# Patient Record
Sex: Female | Born: 1965 | Race: Black or African American | Hispanic: No | Smoking: Never smoker
Health system: Southern US, Community
[De-identification: ages and names within clinical notes are randomized; demographics above are authoritative.]

## PROBLEM LIST (undated history)

## (undated) DIAGNOSIS — D259 Leiomyoma of uterus, unspecified: Secondary | ICD-10-CM

## (undated) DIAGNOSIS — D649 Anemia, unspecified: Secondary | ICD-10-CM

## (undated) DIAGNOSIS — N950 Postmenopausal bleeding: Secondary | ICD-10-CM

## (undated) DIAGNOSIS — I10 Essential (primary) hypertension: Secondary | ICD-10-CM

## (undated) DIAGNOSIS — N809 Endometriosis, unspecified: Secondary | ICD-10-CM

## (undated) DIAGNOSIS — R51 Headache: Secondary | ICD-10-CM

## (undated) DIAGNOSIS — R519 Headache, unspecified: Secondary | ICD-10-CM

## (undated) HISTORY — DX: Headache: R51

## (undated) HISTORY — PX: UMBILICAL HERNIA REPAIR: SHX196

## (undated) HISTORY — DX: Essential (primary) hypertension: I10

## (undated) HISTORY — PX: ABLATION ON ENDOMETRIOSIS: SHX5787

## (undated) HISTORY — DX: Headache, unspecified: R51.9

## (undated) HISTORY — PX: HERNIA REPAIR: SHX51

## (undated) HISTORY — DX: Anemia, unspecified: D64.9

## (undated) HISTORY — DX: Endometriosis, unspecified: N80.9

---

## 1995-03-07 HISTORY — PX: BREAST MASS EXCISION: SHX1267

## 1997-11-03 ENCOUNTER — Other Ambulatory Visit: Admission: RE | Admit: 1997-11-03 | Discharge: 1997-11-03 | Payer: Self-pay | Admitting: Radiology

## 2000-04-19 ENCOUNTER — Encounter: Admission: RE | Admit: 2000-04-19 | Discharge: 2000-04-19 | Payer: Self-pay | Admitting: Emergency Medicine

## 2000-04-19 ENCOUNTER — Encounter: Payer: Self-pay | Admitting: Emergency Medicine

## 2001-04-24 ENCOUNTER — Encounter: Payer: Self-pay | Admitting: Emergency Medicine

## 2001-04-24 ENCOUNTER — Encounter: Admission: RE | Admit: 2001-04-24 | Discharge: 2001-04-24 | Payer: Self-pay | Admitting: Emergency Medicine

## 2001-05-14 ENCOUNTER — Encounter: Admission: RE | Admit: 2001-05-14 | Discharge: 2001-05-14 | Payer: Self-pay | Admitting: Emergency Medicine

## 2001-05-14 ENCOUNTER — Encounter: Payer: Self-pay | Admitting: Emergency Medicine

## 2001-06-10 ENCOUNTER — Encounter: Payer: Self-pay | Admitting: Surgery

## 2001-06-10 ENCOUNTER — Ambulatory Visit (HOSPITAL_BASED_OUTPATIENT_CLINIC_OR_DEPARTMENT_OTHER): Admission: RE | Admit: 2001-06-10 | Discharge: 2001-06-10 | Payer: Self-pay | Admitting: Surgery

## 2001-06-10 ENCOUNTER — Encounter: Admission: RE | Admit: 2001-06-10 | Discharge: 2001-06-10 | Payer: Self-pay | Admitting: Surgery

## 2001-06-10 ENCOUNTER — Encounter (INDEPENDENT_AMBULATORY_CARE_PROVIDER_SITE_OTHER): Payer: Self-pay | Admitting: *Deleted

## 2001-06-10 HISTORY — PX: BREAST DUCTAL SYSTEM EXCISION: SHX5242

## 2001-07-12 ENCOUNTER — Encounter: Payer: Self-pay | Admitting: Emergency Medicine

## 2001-07-12 ENCOUNTER — Encounter: Admission: RE | Admit: 2001-07-12 | Discharge: 2001-07-12 | Payer: Self-pay | Admitting: Emergency Medicine

## 2003-06-08 ENCOUNTER — Emergency Department (HOSPITAL_COMMUNITY): Admission: AD | Admit: 2003-06-08 | Discharge: 2003-06-08 | Payer: Self-pay | Admitting: Family Medicine

## 2004-03-06 DIAGNOSIS — R011 Cardiac murmur, unspecified: Secondary | ICD-10-CM

## 2004-03-06 HISTORY — DX: Cardiac murmur, unspecified: R01.1

## 2004-03-15 ENCOUNTER — Other Ambulatory Visit: Admission: RE | Admit: 2004-03-15 | Discharge: 2004-03-15 | Payer: Self-pay | Admitting: Family Medicine

## 2004-03-17 ENCOUNTER — Ambulatory Visit (HOSPITAL_COMMUNITY): Admission: RE | Admit: 2004-03-17 | Discharge: 2004-03-17 | Payer: Self-pay | Admitting: Family Medicine

## 2004-03-23 ENCOUNTER — Ambulatory Visit (HOSPITAL_COMMUNITY): Admission: RE | Admit: 2004-03-23 | Discharge: 2004-03-23 | Payer: Self-pay | Admitting: Family Medicine

## 2004-03-23 ENCOUNTER — Encounter: Payer: Self-pay | Admitting: Cardiovascular Disease

## 2004-03-23 ENCOUNTER — Ambulatory Visit: Payer: Self-pay | Admitting: Cardiovascular Disease

## 2004-12-16 ENCOUNTER — Emergency Department (HOSPITAL_COMMUNITY): Admission: EM | Admit: 2004-12-16 | Discharge: 2004-12-16 | Payer: Self-pay | Admitting: Emergency Medicine

## 2005-05-10 ENCOUNTER — Other Ambulatory Visit: Admission: RE | Admit: 2005-05-10 | Discharge: 2005-05-10 | Payer: Self-pay | Admitting: Family Medicine

## 2005-10-27 ENCOUNTER — Ambulatory Visit (HOSPITAL_COMMUNITY): Admission: RE | Admit: 2005-10-27 | Discharge: 2005-10-27 | Payer: Self-pay | Admitting: Obstetrics

## 2005-10-27 HISTORY — PX: HYSTEROSCOPY W/ ENDOMETRIAL ABLATION: SUR665

## 2007-05-29 ENCOUNTER — Emergency Department (HOSPITAL_COMMUNITY): Admission: EM | Admit: 2007-05-29 | Discharge: 2007-05-29 | Payer: Self-pay | Admitting: Emergency Medicine

## 2010-07-22 NOTE — Op Note (Signed)
NAMELASHANTA, [name]:  [id number]   MEDICAL RECORD NO.:  [id number]          PATIENT TYPE:  AMB   LOCATION:  SDC                           FACILITY:  [facility]   PHYSICIAN:  Charles A. Clearance Coots, M.D.DATE OF BIRTH:  May 29, 1965   DATE OF PROCEDURE:  10/27/2005  DATE OF DISCHARGE:                                 OPERATIVE REPORT   PREOPERATIVE DIAGNOSIS:  Menorrhagia.   POSTOPERATIVE DIAGNOSIS:  Menorrhagia.   PROCEDURE:  Hysteroscopy, bipolar endometrial ablation.   SURGEON:  Charles A. Clearance Coots, M.D.   ANESTHESIA:  General.   ESTIMATED BLOOD LOSS:  Minimal.   SPECIMEN:  None.   COMPLICATIONS:  None.   OPERATION:  The patient was brought to the operating room and after  satisfactory general endotracheal anesthesia, the vagina was prepped and  draped in the usual sterile fashion.  A sterile speculum was inserted in the  vaginal vault and the cervix was isolated.  The anterior lip of the cervix  was grasped with a single tooth tenaculum. The uterus was sounded to 9 cm  and the internal os of the uterus was reached at 3 cm from the external os  leaving a cavity length of 6 cm for endometrial ablation. The cavity width  was 4.2 cm.  Hysteroscopy was performed in routine fashion prior to the  procedure and the endometrial surface was noted to be clear without evidence  of polyps or septum.  The NovaSure bipolar endometrial ablation technique  was then performed in routine fashion at power of 139 watts for 76 seconds.  All instruments were then retired.  Post procedure hysteroscopy was  performed and the endometrial surface was noted to be ablated  satisfactorily.  The hysteroscope was removed and all instruments were  retired.  The patient was taken to the recovery room in satisfactory  condition.      Charles A. Clearance Coots, M.D.  Electronically Signed     CAH/MEDQ  D:  10/27/2005  T:  10/27/2005  Job:  [id number]

## 2010-07-22 NOTE — Op Note (Signed)
[facility] [place]  Patient:    Elaine [name], Elaine [name] Visit Number: [id number] MRN: [id number]          Service Type: DSU Location: [facility] Attending Physician:  Charlton Haws Dictated by:   Currie Paris, M.D. Proc. Date: 06/10/01 Admit Date:  06/10/2001 Discharge Date: 06/10/2001   CC:         Reuben Likes, M.D.  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  OPERATION:  Ductal excision of left breast, three oclock position.  SURGEON:  Currie Paris, M.D.  ANESTHESIA:  General (LMA).  CLINICAL HISTORY:  This patient has a left breast mass with a nipple discharge that has been fairly vigorous.  The patient also had a couple of breast masses palpable, one at about the two oclock and one at about the 3:30 positions, about 1 cm in size.  Touching the lower one produced nipple discharge, and both ultrasound and ductogram showed a markedly dilated duct and a mass.  We elected to inject some blue dye and do an excision to include the masses.  DESCRIPTION OF PROCEDURE:  The patient was seen in the holding area and had no further questions.  In the operating room, the two palpable masses were marked, and actually a third very tiny little mass at the areolar margin was also identified and marked.  Blue dye was vigorous in coming out of duct at about the three oclock position.  After satisfactory general anesthesia was obtained, the breast was prepped and draped.  Since the palpable masses were a few centimeters from the nipple areolar edge, I went ahead and made a transverse incision, elevated a flap to get to the superior one, then another flap inferiorly, getting around  and beyond the inferior one, and then a flap going medially so that I was underneath the duct, and divided the ductal tissue, and identified the blue duct.  I previously placed a tear duct probe down that and confirmed that this duct travelled laterally in the breast.  Once I had the duct itself divided from the under surface of the nipple, I grasped that tissue with an Allis, and then did an excision going down basically to the chest wall, and going laterally, taking a couple of centimeters of tissue superiorly and inferiorly to include the palpable masses, and in fact, a fourth palpable mass was noted at the very lateral edge of the excision, and I carried the excision out as far as the latissimus so that I was sure that I had gotten to the edge of the ductal tissue.  I did not see any blue dye in any of the breast tissue that I divided across.  Once this tissue was out, I then shaved a little piece of tissue from the under surface of the nipple where the small subareolar mass was palpable, and this was only about a 2 mm area, but felt firmer, and sent this as a little separate specimen.  I injected some Xylocaine to just help with initial postoperative pain management.  The wound was checked for hemostasis and spent several minutes making sure everything was dry using the cautery.  Breast tissue itself was reapproximated with some 3-0 Vicryl and the skin with 4-0 Monocryl subcuticular  plus Steri-Strips.  The patient tolerated the procedure well.  There were no operative complications.  All counts were correct. Dictated by:   Currie Paris, M.D. Attending Physician:  Charlton Haws DD:  06/10/01 TD:  06/10/01 Job: [id number]

## 2011-03-15 ENCOUNTER — Other Ambulatory Visit: Payer: Self-pay | Admitting: Family Medicine

## 2011-03-15 DIAGNOSIS — Z1231 Encounter for screening mammogram for malignant neoplasm of breast: Secondary | ICD-10-CM

## 2011-04-13 ENCOUNTER — Other Ambulatory Visit: Payer: Self-pay | Admitting: Radiology

## 2011-04-19 ENCOUNTER — Other Ambulatory Visit: Payer: Self-pay | Admitting: Radiology

## 2011-04-19 HISTORY — PX: BREAST BIOPSY: SHX20

## 2011-05-01 ENCOUNTER — Ambulatory Visit (INDEPENDENT_AMBULATORY_CARE_PROVIDER_SITE_OTHER): Payer: Self-pay | Admitting: General Surgery

## 2011-05-18 ENCOUNTER — Encounter (INDEPENDENT_AMBULATORY_CARE_PROVIDER_SITE_OTHER): Payer: Self-pay | Admitting: General Surgery

## 2011-05-22 ENCOUNTER — Encounter (INDEPENDENT_AMBULATORY_CARE_PROVIDER_SITE_OTHER): Payer: Self-pay | Admitting: General Surgery

## 2011-05-22 ENCOUNTER — Ambulatory Visit (INDEPENDENT_AMBULATORY_CARE_PROVIDER_SITE_OTHER): Payer: BC Managed Care – PPO | Admitting: General Surgery

## 2011-05-22 VITALS — BP 128/82 | HR 80 | Temp 97.6°F | Resp 18 | Ht 69.0 in | Wt 162.5 lb

## 2011-05-22 DIAGNOSIS — N63 Unspecified lump in unspecified breast: Secondary | ICD-10-CM

## 2011-05-22 DIAGNOSIS — N6320 Unspecified lump in the left breast, unspecified quadrant: Secondary | ICD-10-CM | POA: Insufficient documentation

## 2011-05-22 NOTE — Progress Notes (Signed)
Patient ID: Elaine [name], female   DOB: 1965/12/31, 46 y.o.   MRN: [id number]  Chief Complaint  Patient presents with  . New Evaluation    left breast mass    HPI Elaine [name] is a 46 y.o. female.   HPI She is referred by Dr. Tilda Burrow because of a persistent left breast mass. This was discovered on physical examination. She was referred to the [facility] and underwent 2 needle biopsies. These demonstrated findings consistent with a benign nipple adenoma. No malignancy was identified.  However, the mass persists and she has been sent here to discuss removal.   Menarche was at age 64 or 88. First child birth was at age 72. She did not breast-feed. No family history of breast cancer.  She has not had a menstrual period since the Norplant procedure about 10 years ago.  History reviewed. No pertinent past medical history.  Past Surgical History  Procedure Date  . Breast biopsy 04/19/11  . Hernia repair     umbilical    History reviewed. No pertinent family history.  Social History History  Substance Use Topics  . Smoking status: Never Smoker   . Smokeless tobacco: Never Used  . Alcohol Use: No    No Known Allergies  No current outpatient prescriptions on file.    Review of Systems Review of Systems  Constitutional: Negative.   HENT: Negative.   Respiratory: Negative.   Cardiovascular: Negative.   Gastrointestinal: Negative.   Genitourinary: Negative.   Musculoskeletal: Negative.   Neurological: Negative.   Hematological: Negative.     Blood pressure 128/82, pulse 80, temperature 97.6 F (36.4 C), temperature source Temporal, resp. rate 18, height 5\' 9"  (1.753 m), weight 162 lb 8 oz (73.71 kg).  Physical Exam Physical Exam  Constitutional:       Tall thin female in no acute distress.  HENT:  Head: Normocephalic and atraumatic.       Wears glasses.  Eyes: Conjunctivae are normal. No scleral icterus.  Neck: Neck supple.  Cardiovascular: Normal rate and  regular rhythm.   Pulmonary/Chest: Effort normal and breath sounds normal.       Left breast-2 cm palpable mass at the 3:00 position of the nipple areolar complex. 2 small punctate wounds adjacent to this. No other palpable masses.  Right breast-no palpable masses or suspicious changes.  No axillary adenopathy. No supraclavicular adenopathy.  Abdominal: Soft. Bowel sounds are normal.       Limited midline scar and absent umbilicus.  Musculoskeletal: Normal range of motion. She exhibits no edema.  Lymphadenopathy:    She has no cervical adenopathy.    Data Reviewed Pathology report and imaging reports.  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    Plan    Removal of left breast mass. The procedure, rationale, and risks have been discussed with her. Risks include but are not limited to bleeding, infection, wound healing problems, anesthesia, recurrence.  She seems to understand all this and would like to proceed.       Lamari Youngers J 05/22/2011, 6:20 PM

## 2011-05-22 NOTE — Patient Instructions (Signed)
We will schedule your surgery

## 2011-06-29 ENCOUNTER — Encounter (INDEPENDENT_AMBULATORY_CARE_PROVIDER_SITE_OTHER): Payer: Self-pay

## 2011-07-09 ENCOUNTER — Encounter (HOSPITAL_COMMUNITY): Payer: Self-pay | Admitting: Emergency Medicine

## 2011-07-09 ENCOUNTER — Emergency Department (HOSPITAL_COMMUNITY)
Admission: EM | Admit: 2011-07-09 | Discharge: 2011-07-09 | Disposition: A | Discharge status: Home / Self Care | Payer: Private Health Insurance - Indemnity | Attending: Emergency Medicine | Admitting: Emergency Medicine

## 2011-07-09 DIAGNOSIS — N63 Unspecified lump in unspecified breast: Secondary | ICD-10-CM | POA: Insufficient documentation

## 2011-07-09 DIAGNOSIS — N6320 Unspecified lump in the left breast, unspecified quadrant: Secondary | ICD-10-CM

## 2011-07-09 DIAGNOSIS — N644 Mastodynia: Secondary | ICD-10-CM | POA: Insufficient documentation

## 2011-07-09 NOTE — ED Notes (Signed)
C/o knot on L breast that has been painful x 2 days.  Reports milky discharge from L breast.

## 2011-07-09 NOTE — ED Notes (Signed)
Pt c/o painful and hard spot on left breast with white discharge.  States she had the same problem on the same breast appox 4 years ago and had the section removed.  No fever/chills, n/v, SOB

## 2011-07-09 NOTE — Discharge Instructions (Signed)
Please return back to the surgery clinic to have them evaluate your breast mass. Tylenol or Motrin for pain. Return to the emergency department for worsening condition or new concerning symptoms

## 2011-07-09 NOTE — ED Notes (Signed)
No rx given, pt voiced understanding to f/u with breast clinic.

## 2011-07-10 NOTE — ED Provider Notes (Signed)
History     CSN: [id number]  Arrival date & time 07/09/11  2159   First MD Initiated Contact with Patient 07/09/11 2305      Chief Complaint  Patient presents with  . Breast Pain    (Consider location/radiation/quality/duration/timing/severity/associated sxs/prior treatment) HPI 46 year old female presents to the emergency department with complaint of enlarging left breast mass. Patient reports she has had a mass in her breast for some time but over the last 2 days she has noticed increase in size and some drainage from her nipple of a milky substance. Patient denies any fever no redness no swelling no warmth to the breast. Patient was seen 2 months ago in the surgery clinic to have this mass excised, but had difficulties with her insurance and was not able to have it done. Per his medical records, patient has had FNA of this mass and it is a benign mass.  History reviewed. No pertinent past medical history.  Past Surgical History  Procedure Date  . Breast biopsy 04/19/11  . Hernia repair     umbilical    No family history on file.  History  Substance Use Topics  . Smoking status: Never Smoker   . Smokeless tobacco: Never Used  . Alcohol Use: No    OB History    Grav Para Term Preterm Abortions TAB SAB Ect Mult Living                  Review of Systems  All other systems reviewed and are negative.    Allergies  Review of patient's allergies indicates no known allergies.  Home Medications   Current Outpatient Rx  Name Route Sig Dispense Refill  . VITAMIN D 400 UNITS PO TABS Oral Take 400 Units by mouth 4 (four) times daily.      BP 130/57  Pulse 66  Temp(Src) 98.3 F (36.8 C) (Oral)  Resp 20  SpO2 99%  Physical Exam  Nursing note and vitals reviewed. Constitutional: She appears well-developed and well-nourished. No distress.  HENT:  Head: Normocephalic and atraumatic.  Neck: Normal range of motion. Neck supple. No JVD present. No tracheal deviation  present. No thyromegaly present.  Cardiovascular: Normal rate, regular rhythm and intact distal pulses.  Exam reveals no gallop and no friction rub.   No murmur heard. Pulmonary/Chest: Effort normal and breath sounds normal. No stridor. No respiratory distress. She has no wheezes. She has no rales. She exhibits no tenderness.  Lymphadenopathy:    She has no cervical adenopathy.  Skin: She is not diaphoretic.       Left breast with irregular firm mass at 4:00 just lateral to her areola. Mass approximately 3 centimeters. No fluctuance, no erythema no edema. No drainage from the nipple noted    ED Course  Procedures (including critical care time)  Labs Reviewed - No data to display No results found.   1. Left breast mass       MDM  46 year old female with left breast mass. Mass does not appear to be infected requiring I&D at this time. It is beyond the scope of the emergency department to remove such a mass. Have referred patient back to the breast clinic and or general surgery for reevaluation to have mass removed        Olivia Mackie, MD 07/10/11 0401

## 2011-09-07 ENCOUNTER — Emergency Department (HOSPITAL_COMMUNITY)
Admission: EM | Admit: 2011-09-07 | Discharge: 2011-09-07 | Disposition: A | Discharge status: Home / Self Care | Payer: No Typology Code available for payment source | Attending: Emergency Medicine | Admitting: Emergency Medicine

## 2011-09-07 ENCOUNTER — Emergency Department (HOSPITAL_COMMUNITY): Payer: No Typology Code available for payment source

## 2011-09-07 ENCOUNTER — Encounter (HOSPITAL_COMMUNITY): Payer: Self-pay | Admitting: Emergency Medicine

## 2011-09-07 DIAGNOSIS — S335XXA Sprain of ligaments of lumbar spine, initial encounter: Secondary | ICD-10-CM | POA: Insufficient documentation

## 2011-09-07 DIAGNOSIS — Y998 Other external cause status: Secondary | ICD-10-CM | POA: Insufficient documentation

## 2011-09-07 DIAGNOSIS — M79609 Pain in unspecified limb: Secondary | ICD-10-CM | POA: Insufficient documentation

## 2011-09-07 DIAGNOSIS — Y93I9 Activity, other involving external motion: Secondary | ICD-10-CM | POA: Insufficient documentation

## 2011-09-07 DIAGNOSIS — S39012A Strain of muscle, fascia and tendon of lower back, initial encounter: Secondary | ICD-10-CM

## 2011-09-07 DIAGNOSIS — Y92410 Unspecified street and highway as the place of occurrence of the external cause: Secondary | ICD-10-CM | POA: Insufficient documentation

## 2011-09-07 DIAGNOSIS — M79671 Pain in right foot: Secondary | ICD-10-CM

## 2011-09-07 DIAGNOSIS — M25512 Pain in left shoulder: Secondary | ICD-10-CM

## 2011-09-07 DIAGNOSIS — M25519 Pain in unspecified shoulder: Secondary | ICD-10-CM | POA: Insufficient documentation

## 2011-09-07 MED ORDER — IBUPROFEN 600 MG PO TABS: 600.0000 mg | ORAL_TABLET | Freq: Four times a day (QID) | ORAL | Status: AC | PRN

## 2011-09-07 MED ORDER — TRAMADOL HCL 50 MG PO TABS
50.0000 mg | ORAL_TABLET | Freq: Four times a day (QID) | ORAL | Status: AC | PRN
Start: 2011-09-07 — End: 2011-09-17

## 2011-09-07 MED ORDER — CYCLOBENZAPRINE HCL 10 MG PO TABS: 10.0000 mg | ORAL_TABLET | Freq: Two times a day (BID) | ORAL | Status: AC | PRN
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
## 2011-09-08 NOTE — ED Provider Notes (Signed)
Medical screening examination/treatment/procedure(s) were performed by non-physician practitioner and as supervising physician I was immediately available for consultation/collaboration.   Suzi Roots, MD 09/08/11 [phone]

## 2011-12-20 DIAGNOSIS — N6002 Solitary cyst of left breast: Secondary | ICD-10-CM | POA: Insufficient documentation

## 2012-04-14 ENCOUNTER — Encounter (HOSPITAL_COMMUNITY): Payer: Self-pay | Admitting: Emergency Medicine

## 2012-04-14 ENCOUNTER — Emergency Department (HOSPITAL_COMMUNITY)
Admission: EM | Admit: 2012-04-14 | Discharge: 2012-04-14 | Disposition: A | Discharge status: Home / Self Care | Payer: BC Managed Care – PPO | Attending: Emergency Medicine | Admitting: Emergency Medicine

## 2012-04-14 DIAGNOSIS — M7022 Olecranon bursitis, left elbow: Secondary | ICD-10-CM

## 2012-04-14 DIAGNOSIS — M25429 Effusion, unspecified elbow: Secondary | ICD-10-CM | POA: Insufficient documentation

## 2012-04-14 DIAGNOSIS — Z79899 Other long term (current) drug therapy: Secondary | ICD-10-CM | POA: Insufficient documentation

## 2012-04-14 DIAGNOSIS — M7020 Olecranon bursitis, unspecified elbow: Secondary | ICD-10-CM | POA: Insufficient documentation

## 2012-04-14 MED ORDER — NAPROXEN 500 MG PO TABS: 500.0000 mg | ORAL_TABLET | Freq: Three times a day (TID) | ORAL | Status: DC

## 2012-04-14 NOTE — ED Notes (Signed)
Pt states she was lifting heavy items at work last Tuesday and her L elbow began to hurt.  Full ROM.

## 2012-04-14 NOTE — ED Provider Notes (Signed)
History     CSN: [id number]  Arrival date & time 04/14/12  1478   First MD Initiated Contact with Patient 04/14/12 [phone]      Chief Complaint  Patient presents with  . Arm Pain    (Consider location/radiation/quality/duration/timing/severity/associated sxs/prior treatment) Patient is a 47 y.o. female presenting with arm pain. The history is provided by the patient.  Arm Pain   patient here complaining of left elbow pain that started 5 days ago after she lifted a heavy object. Pain is localized to her joint and characterized sharp and worse with movement. She has been using anti-inflammatories as well as cold therapy. Denies indirect trauma. Now complains of some joint swelling. No weakness numbness or tingling in her left hand.  History reviewed. No pertinent past medical history.  Past Surgical History  Procedure Laterality Date  . Breast biopsy  04/19/11  . Hernia repair      umbilical    History reviewed. No pertinent family history.  History  Substance Use Topics  . Smoking status: Never Smoker   . Smokeless tobacco: Never Used  . Alcohol Use: No    OB History   Grav Para Term Preterm Abortions TAB SAB Ect Mult Living                  Review of Systems  All other systems reviewed and are negative.    Allergies  Review of patient's allergies indicates no known allergies.  Home Medications   Current Outpatient Rx  Name  Route  Sig  Dispense  Refill  . naproxen sodium (ANAPROX) 220 MG tablet   Oral   Take 220 mg by mouth 2 (two) times daily with a meal.         . vitamin D, CHOLECALCIFEROL, 400 UNITS tablet   Oral   Take 400 Units by mouth daily.            BP 148/76  Pulse 78  Temp(Src) 98.5 F (36.9 C) (Oral)  Resp 16  SpO2 100%  Physical Exam  Nursing note and vitals reviewed. Constitutional: She is oriented to person, place, and time. She appears well-developed and well-nourished.  Non-toxic appearance. No distress.  HENT:  Head:  Normocephalic and atraumatic.  Eyes: Conjunctivae, EOM and lids are normal. Pupils are equal, round, and reactive to light.  Neck: Normal range of motion. Neck supple. No tracheal deviation present. No mass present.  Cardiovascular: Normal rate, regular rhythm and normal heart sounds.  Exam reveals no gallop.   No murmur heard. Pulmonary/Chest: Effort normal and breath sounds normal. No stridor. No respiratory distress. She has no decreased breath sounds. She has no wheezes. She has no rhonchi. She has no rales.  Abdominal: Soft. Normal appearance and bowel sounds are normal. She exhibits no distension. There is no tenderness. There is no rebound and no CVA tenderness.  Musculoskeletal: Normal range of motion. She exhibits no edema and no tenderness.       Left elbow: She exhibits normal range of motion and no effusion. Tenderness found.  Neurological: She is alert and oriented to person, place, and time. She has normal strength. No cranial nerve deficit or sensory deficit. GCS eye subscore is 4. GCS verbal subscore is 5. GCS motor subscore is 6.  Skin: Skin is warm and dry. No abrasion and no rash noted.  Psychiatric: She has a normal mood and affect. Her speech is normal and behavior is normal.    ED Course  Procedures (including  critical care time)  Labs Reviewed - No data to display No results found.   No diagnosis found.    MDM  Pt with likely bursitis , will tx        Toy Baker, MD 04/14/12 [phone]

## 2012-12-01 ENCOUNTER — Emergency Department (HOSPITAL_COMMUNITY)
Admission: EM | Admit: 2012-12-01 | Discharge: 2012-12-01 | Disposition: A | Discharge status: Home / Self Care | Payer: BC Managed Care – PPO | Attending: Emergency Medicine | Admitting: Emergency Medicine

## 2012-12-01 ENCOUNTER — Encounter (HOSPITAL_COMMUNITY): Payer: Self-pay

## 2012-12-01 DIAGNOSIS — L0291 Cutaneous abscess, unspecified: Secondary | ICD-10-CM

## 2012-12-01 DIAGNOSIS — IMO0002 Reserved for concepts with insufficient information to code with codable children: Secondary | ICD-10-CM | POA: Insufficient documentation

## 2012-12-01 DIAGNOSIS — Z791 Long term (current) use of non-steroidal anti-inflammatories (NSAID): Secondary | ICD-10-CM | POA: Insufficient documentation

## 2012-12-01 MED ORDER — CLINDAMYCIN HCL 300 MG PO CAPS: 300.0000 mg | ORAL_CAPSULE | Freq: Four times a day (QID) | ORAL | Status: DC

## 2012-12-01 MED ORDER — HYDROCODONE-ACETAMINOPHEN 5-325 MG PO TABS: 1.0000 | ORAL_TABLET | Freq: Four times a day (QID) | ORAL | Status: DC | PRN

## 2012-12-01 MED ORDER — HYDROCODONE-ACETAMINOPHEN 5-325 MG PO TABS
1.0000 | ORAL_TABLET | Freq: Once | ORAL | Status: AC
  Administered 2012-12-01: 1 via ORAL
  Filled 2012-12-01: qty 1

## 2012-12-01 NOTE — ED Notes (Signed)
She has two areas of erythema/edema at right axilla.  She thinks she may have been bitten by an insect or spider recently.  She is in no distress.

## 2012-12-01 NOTE — ED Provider Notes (Signed)
CSN: [id number]     Arrival date & time 12/01/12  4098 History   First MD Initiated Contact with Patient 12/01/12 0915     Chief Complaint  Patient presents with  . Abscess   (Consider location/radiation/quality/duration/timing/severity/associated sxs/prior Treatment) HPI Comments: 47 year old female presents to the emergency department with an abscess under her right armpit that she noticed 4 days ago. Patient states she was cleaning out her she had been thought she may have been bit by a spider or mosquito, noticed a little bump which has increased over the past 4 days. It is tender to touch. Denies drainage. Denies fever or chills. She tried putting alcohol on it without relief.  Patient is a 47 y.o. female presenting with abscess. The history is provided by the patient.  Abscess Associated symptoms: no fever     History reviewed. No pertinent past medical history. Past Surgical History  Procedure Laterality Date  . Breast biopsy  04/19/11  . Hernia repair      umbilical   No family history on file. History  Substance Use Topics  . Smoking status: Never Smoker   . Smokeless tobacco: Never Used  . Alcohol Use: No   OB History   Grav Para Term Preterm Abortions TAB SAB Ect Mult Living                 Review of Systems  Constitutional: Negative for fever and chills.  Skin:       Positive for abscess.  All other systems reviewed and are negative.    Allergies  Review of patient's allergies indicates no known allergies.  Home Medications   Current Outpatient Rx  Name  Route  Sig  Dispense  Refill  . naproxen (NAPROSYN) 500 MG tablet   Oral   Take 1 tablet (500 mg total) by mouth 3 (three) times daily with meals.   30 tablet   0   . naproxen sodium (ANAPROX) 220 MG tablet   Oral   Take 220 mg by mouth 2 (two) times daily with a meal.         . vitamin D, CHOLECALCIFEROL, 400 UNITS tablet   Oral   Take 400 Units by mouth daily.           BP 146/83   Pulse 72  Temp(Src) 99.2 F (37.3 C) (Oral)  Resp 16  SpO2 100% Physical Exam  Nursing note and vitals reviewed. Constitutional: She is oriented to person, place, and time. She appears well-developed and well-nourished. No distress.  HENT:  Head: Normocephalic and atraumatic.  Mouth/Throat: Oropharynx is clear and moist.  Eyes: Conjunctivae are normal.  Neck: Normal range of motion. Neck supple.  Cardiovascular: Normal rate, regular rhythm and normal heart sounds.   Pulmonary/Chest: Effort normal and breath sounds normal.  Musculoskeletal: Normal range of motion. She exhibits no edema.  Neurological: She is alert and oriented to person, place, and time.  Skin: Skin is warm and dry. She is not diaphoretic.  2 cm area of fluctuance under right axilla, no drainage, no surrounding cellulitis. 1 cm area of fluctuance directly under and above the first, no drainage, no surrounding cellulitis. Tender to touch.  Psychiatric: She has a normal mood and affect. Her behavior is normal.    ED Course  Procedures (including critical care time) INCISION AND DRAINAGE Performed by: Johnnette Gourd Consent: Verbal consent obtained. Risks and benefits: risks, benefits and alternatives were discussed Type: abscess  Body area: right axilla  Anesthesia: local  infiltration  Incision was made with a scalpel.  Local anesthetic: lidocaine 2% with epinephrine  Anesthetic total: 2 ml  Complexity: complex Blunt dissection to break up loculations  Drainage: purulent  Drainage amount: small  Packing material: none  Patient tolerance: Patient tolerated the procedure well with no immediate complications.    Labs Review Labs Reviewed - No data to display Imaging Review No results found.  MDM   1. Abscess    Patient with abscess without cellulitis. Large abscess drained, small amount of purulent material expressed. Patient refuses any further drainage of either of the other abscesses. Given  she refuses drainage, I will put her on antibiotics. She will followup with her PCP. Return precautions discussed. Patient states understanding of plan and is agreeable.    Trevor Mace, PA-C 12/01/12 1046

## 2012-12-01 NOTE — ED Provider Notes (Signed)
Medical screening examination/treatment/procedure(s) were performed by non-physician practitioner and as supervising physician I was immediately available for consultation/collaboration.  Darnette Lampron L Gregroy Dombkowski, MD 12/01/12 1552 

## 2013-12-18 ENCOUNTER — Other Ambulatory Visit: Payer: Self-pay | Admitting: Obstetrics and Gynecology

## 2013-12-19 ENCOUNTER — Other Ambulatory Visit: Payer: Self-pay | Admitting: Nurse Practitioner

## 2013-12-22 ENCOUNTER — Other Ambulatory Visit: Payer: Self-pay | Admitting: Nurse Practitioner

## 2013-12-23 ENCOUNTER — Other Ambulatory Visit: Payer: Self-pay | Admitting: Nurse Practitioner

## 2013-12-23 DIAGNOSIS — N611 Abscess of the breast and nipple: Secondary | ICD-10-CM

## 2013-12-29 ENCOUNTER — Ambulatory Visit
Admission: RE | Admit: 2013-12-29 | Discharge: 2013-12-29 | Disposition: A | Discharge status: Home / Self Care | Payer: BC Managed Care – PPO | Source: Ambulatory Visit | Attending: Nurse Practitioner | Admitting: Nurse Practitioner

## 2013-12-29 ENCOUNTER — Other Ambulatory Visit: Payer: Self-pay | Admitting: Nurse Practitioner

## 2013-12-29 DIAGNOSIS — N611 Abscess of the breast and nipple: Secondary | ICD-10-CM

## 2014-01-09 ENCOUNTER — Ambulatory Visit: Payer: Self-pay | Admitting: Surgery

## 2014-01-09 HISTORY — PX: BREAST MASS EXCISION: SHX1267

## 2014-04-07 ENCOUNTER — Emergency Department (HOSPITAL_COMMUNITY)
Admission: EM | Admit: 2014-04-07 | Discharge: 2014-04-07 | Disposition: A | Discharge status: Home / Self Care | Payer: 59 | Attending: Emergency Medicine | Admitting: Emergency Medicine

## 2014-04-07 ENCOUNTER — Encounter (HOSPITAL_COMMUNITY): Payer: Self-pay | Admitting: Emergency Medicine

## 2014-04-07 DIAGNOSIS — Y998 Other external cause status: Secondary | ICD-10-CM | POA: Diagnosis not present

## 2014-04-07 DIAGNOSIS — Y9289 Other specified places as the place of occurrence of the external cause: Secondary | ICD-10-CM | POA: Insufficient documentation

## 2014-04-07 DIAGNOSIS — S59902A Unspecified injury of left elbow, initial encounter: Secondary | ICD-10-CM | POA: Diagnosis present

## 2014-04-07 DIAGNOSIS — Y9389 Activity, other specified: Secondary | ICD-10-CM | POA: Diagnosis not present

## 2014-04-07 DIAGNOSIS — M778 Other enthesopathies, not elsewhere classified: Secondary | ICD-10-CM | POA: Diagnosis not present

## 2014-04-07 DIAGNOSIS — X58XXXA Exposure to other specified factors, initial encounter: Secondary | ICD-10-CM | POA: Diagnosis not present

## 2014-04-07 DIAGNOSIS — Z79899 Other long term (current) drug therapy: Secondary | ICD-10-CM | POA: Diagnosis not present

## 2014-04-07 LAB — I-STAT TROPONIN, ED: TROPONIN I, POC: 0 ng/mL (ref 0.00–0.08)

## 2014-04-07 MED ORDER — TRAMADOL HCL 50 MG PO TABS: 50.0000 mg | ORAL_TABLET | Freq: Four times a day (QID) | ORAL | Status: DC | PRN

## 2014-04-07 NOTE — ED Notes (Signed)
Pt reported that she had "fluttering in her chest" yesterday after taking Excedrin  Arthritis Strength

## 2014-04-07 NOTE — ED Notes (Signed)
Pt reports l/arm pain after lifting heavy object 2 weeks ago. Denies chest pain. Taking OTC meds, with relief. Pt reports that " l/hand is going numb'. Numbness  X 1 day. Pt has equal grips, r/and l/hand

## 2014-04-07 NOTE — Discharge Instructions (Signed)
Tendinitis Tendinitis is swelling and inflammation of the tendons. Tendons are band-like tissues that connect muscle to bone. Tendinitis commonly occurs in the:   Shoulders (rotator cuff).  Heels (Achilles tendon).  Elbows (triceps tendon). CAUSES Tendinitis is usually caused by overusing the tendon, muscles, and joints involved. When the tissue surrounding a tendon (synovium) becomes inflamed, it is called tenosynovitis. Tendinitis commonly develops in people whose jobs require repetitive motions. SYMPTOMS  Pain.  Tenderness.  Mild swelling. DIAGNOSIS Tendinitis is usually diagnosed by physical exam. Your health care provider may also order X-rays or other imaging tests. TREATMENT Your health care provider may recommend certain medicines or exercises for your treatment. HOME CARE INSTRUCTIONS   Use a sling or splint for as long as directed by your health care provider until the pain decreases.  Put ice on the injured area.  Put ice in a plastic bag.  Place a towel between your skin and the bag.  Leave the ice on for 15-20 minutes, 3-4 times a day, or as directed by your health care provider.  Avoid using the limb while the tendon is painful. Perform gentle range of motion exercises only as directed by your health care provider. Stop exercises if pain or discomfort increase, unless directed otherwise by your health care provider.  Only take over-the-counter or prescription medicines for pain, discomfort, or fever as directed by your health care provider. SEEK MEDICAL CARE IF:   Your pain and swelling increase.  You develop new, unexplained symptoms, especially increased numbness in the hands. MAKE SURE YOU:   Understand these instructions.  Will watch your condition.  Will get help right away if you are not doing well or get worse. Document Released: 02/18/2000 Document Revised: 07/07/2013 Document Reviewed: 05/09/2010 [facility] Patient Information 2015 [place],  [place]. This information is not intended to replace advice given to you by your health care provider. Make sure you discuss any questions you have with your health care provider. Tennis Elbow Your caregiver has diagnosed you with a condition often referred to as "tennis elbow." This results from small tears or soreness (inflammation) at the start (origin) of the extensor muscles of the forearm. Although the condition is often called tennis or golfer's elbow, it is caused by any repetitive action performed by your elbow. HOME CARE INSTRUCTIONS  If the condition has been short lived, rest may be the only treatment required. Using your opposite hand or arm to perform the task may help. Even changing your grip may help rest the extremity. These may even prevent the condition from recurring.  Longer standing problems, however, will often be relieved faster by:  Using anti-inflammatory agents.  Applying ice packs for 30 minutes at the end of the working day, at bed time, or when activities are finished.  Your caregiver may also have you wear a splint or sling. This will allow the inflamed tendon to heal. At times, steroid injections aided with a local anesthetic will be required along with splinting for 1 to 2 weeks. Two to three steroid injections will often solve the problem. In some long standing cases, the inflamed tendon does not respond to conservative (non-surgical) therapy. Then surgery may be required to repair it. MAKE SURE YOU:   Understand these instructions.  Will watch your condition.  Will get help right away if you are not doing well or get worse. Document Released: 02/20/2005 Document Revised: 05/15/2011 Document Reviewed: 10/09/2007 [facility] Patient Information 2015 [place], [place]. This information is not intended to replace advice  given to you by your health care provider. Make sure you discuss any questions you have with your health care provider.

## 2014-04-08 NOTE — ED Provider Notes (Signed)
CSN: [id number]     Arrival date & time 04/07/14  1651 History   First MD Initiated Contact with Patient 04/07/14 1656     Chief Complaint  Patient presents with  . Arm Injury    pt was lifting heavy object two weeks ago     (Consider location/radiation/quality/duration/timing/severity/associated sxs/prior Treatment) HPI Comments: Patient presents to the ER for evaluation of pain in the left arm. Patient reports that she injured her arm lifting a heavy object 2 weeks ago. She has been experiencing pain in the outside portion of the elbow ever since. She has been taking over-the-counter medicine without relief. She reports that the pain worsens with movement of the elbow and now she is having some numbness and tingling in the hand.  Patient is a 49 y.o. female presenting with arm injury.  Arm Injury   History reviewed. No pertinent past medical history. Past Surgical History  Procedure Laterality Date  . Breast biopsy  04/19/11  . Hernia repair      umbilical  . Ablation on endometriosis     Family History  Problem Relation Age of Onset  . Hypertension Mother   . Cancer Father   . Hypertension Father    History  Substance Use Topics  . Smoking status: Never Smoker   . Smokeless tobacco: Never Used  . Alcohol Use: No   OB History    No data available     Review of Systems  Musculoskeletal: Positive for myalgias and arthralgias.  Neurological: Positive for numbness.  All other systems reviewed and are negative.     Allergies  Review of patient's allergies indicates no known allergies.  Home Medications   Prior to Admission medications   Medication Sig Start Date End Date Taking? Authorizing Provider  acetaminophen (TYLENOL) 650 MG CR tablet Take 650 mg by mouth every 8 (eight) hours as needed for pain (pain).   Yes Historical Provider, MD  naproxen sodium (ANAPROX) 220 MG tablet Take 220 mg by mouth daily as needed (pain).    Yes Historical Provider, MD  vitamin  D, CHOLECALCIFEROL, 400 UNITS tablet Take 400 Units by mouth daily.    Yes Historical Provider, MD  traMADol (ULTRAM) 50 MG tablet Take 1 tablet (50 mg total) by mouth every 6 (six) hours as needed. 04/07/14   Orpah Greek, MD   BP 128/72 mmHg  Pulse 87  Temp(Src) 98.2 F (36.8 C) (Oral)  Resp 18  SpO2 98% Physical Exam  Constitutional: She is oriented to person, place, and time. She appears well-developed and well-nourished. No distress.  HENT:  Head: Normocephalic and atraumatic.  Right Ear: Hearing normal.  Left Ear: Hearing normal.  Nose: Nose normal.  Mouth/Throat: Oropharynx is clear and moist and mucous membranes are normal.  Eyes: Conjunctivae and EOM are normal. Pupils are equal, round, and reactive to light.  Neck: Normal range of motion. Neck supple.  Cardiovascular: Regular rhythm, S1 normal and S2 normal.  Exam reveals no gallop and no friction rub.   No murmur heard. Pulses:      Radial pulses are 2+ on the left side.  Pulmonary/Chest: Effort normal and breath sounds normal. No respiratory distress. She exhibits no tenderness.  Abdominal: Soft. Normal appearance and bowel sounds are normal. There is no hepatosplenomegaly. There is no tenderness. There is no rebound, no guarding, no tenderness at McBurney's point and negative Murphy's sign. No hernia.  Musculoskeletal: Normal range of motion.       Left elbow:  She exhibits no swelling, no effusion and no deformity. Tenderness found. Lateral epicondyle tenderness noted.  Neurological: She is alert and oriented to person, place, and time. She has normal strength. No cranial nerve deficit or sensory deficit. Coordination normal. GCS eye subscore is 4. GCS verbal subscore is 5. GCS motor subscore is 6.  Skin: Skin is warm, dry and intact. No rash noted. No cyanosis.  Psychiatric: She has a normal mood and affect. Her speech is normal and behavior is normal. Thought content normal.  Nursing note and vitals  reviewed.   ED Course  Procedures (including critical care time) [facility], ED    Imaging Review No results found.   EKG Interpretation None      MDM   Final diagnoses:  Tendinitis of elbow    She presents to the ER for evaluation of pain in the left elbow. This was related to an injury. Pain has been persistent for 2 weeks and now she is experiencing numbness and tingling distal to the elbow. Examination reveals significant tenderness at the lateral epicondyles and significantly increased pain with range of motion against resistance. This is consistent with a tendinitis. She has normal neurologic function, normal pulses. Cardiac workup was negative. Patient appropriate for discharge, rest, analgesia with NSAIDs. Follow-up with primary doctor. She was told that she might need physical therapy if not improved in the next week or so.    Orpah Greek, MD 04/08/14 [phone]

## 2014-04-13 ENCOUNTER — Ambulatory Visit: Payer: Self-pay | Admitting: Nurse Practitioner

## 2014-06-27 NOTE — Op Note (Signed)
PATIENT NAME:  Elaine [name], Elaine [name] MR#:  [id number] DATE OF BIRTH:  [birth year]  DATE OF PROCEDURE:  01/09/2014  PREOPERATIVE DIAGNOSIS: Left breast mass.   POSTOPERATIVE DIAGNOSIS: Left breast mass.   PROCEDURE: Excision of left breast mass.   SURGEON: Loreli Dollar, MD  ANESTHESIA: General.   INDICATIONS: This 49 year old female reports a 3 year history of intermittent episodes of swelling in the left breast with episodes of drainage of fluid and temporary resolution. She came in for evaluation and had physical findings of a mass in the retroareolar portion of the left breast, which was in the lower outer quadrant. According to palpation, this mass was approximately 2.5 cm in dimension.  Mammogram and ultrasound appeared to have a solid mass. This was considered most likely benign, and due to her recurrent symptoms I elected to excise the mass.   PROCEDURE IN DETAIL: The patient was placed on the operating table in the supine position under general anesthesia. The left breast was prepared with ChloraPrep and draped in a sterile manner. Examination demonstrated a small wound in the skin, just about 5-6 mm across at the border of the areola in the lower outer quadrant. There was also a palpable mass consistent with some 2.5 cm in dimension.   A curvilinear elliptical excision was carried out from the 2:30 o'clock  position to the 5:30 o'clock  position of the left breast, at the border of the areola, and carried down through a thin layer of subcutaneous tissue to encounter a mass. There was some scant serous drainage from the mass, but most the mass appeared to be solid. Dissection was carried out using electrocautery for hemostasis and removed the entire mass. I did tag the 5:30 o'clock end of the skin ellipse with a suture for the pathologist's orientation, and submitted it for pathology. The wound was inspected. Several small bleeding points were cauterized. Hemostasis subsequently appeared to be  intact.   The subcutaneous tissues were infiltrated with 0.5% Sensorcaine with epinephrine. Portions of the subcutaneous tissue were approximated with interrupted 5-0 Monocryl. The skin was closed with a running 5-0 Monocryl subcuticular suture, leaving a small opening at the 5:30 o'clock  position for drainage. Dermabond was applied, but I did not apply Dermabond at the 5:30 o'clock position, leaving this small opening for drainage. The Dermabond was allowed to dry, and I then applied  4x4 gauze dressings with 2-inch paper tape.   The patient tolerated the procedure satisfactorily and was then prepared for transfer to the recovery room.    ____________________________ Lenna Sciara. Rochel Brome, MD jws:MT D: 01/09/2014 08:44:55 ET T: 01/09/2014 10:15:43 ET JOB#: [id number]  cc: Loreli Dollar, MD, <Dictator> Loreli Dollar MD ELECTRONICALLY SIGNED 01/11/2014 12:51

## 2014-06-29 LAB — SURGICAL PATHOLOGY

## 2016-11-23 IMAGING — CR DG ELBOW 2V*L*
1 series · 2 of 2 positions shown · non-contrast
Comparison: None.

CLINICAL DATA: Lifting injury 3 weeks ago with persistent left
shoulder and elbow discomfort with symptoms radiating into the hands
and fingers

EXAM:
LEFT ELBOW - 2 VIEW

[Series 1: kdxr elbow left ap and lateral · 0.14mm/px · 2 of 2 slices shown]
[im 1/2]
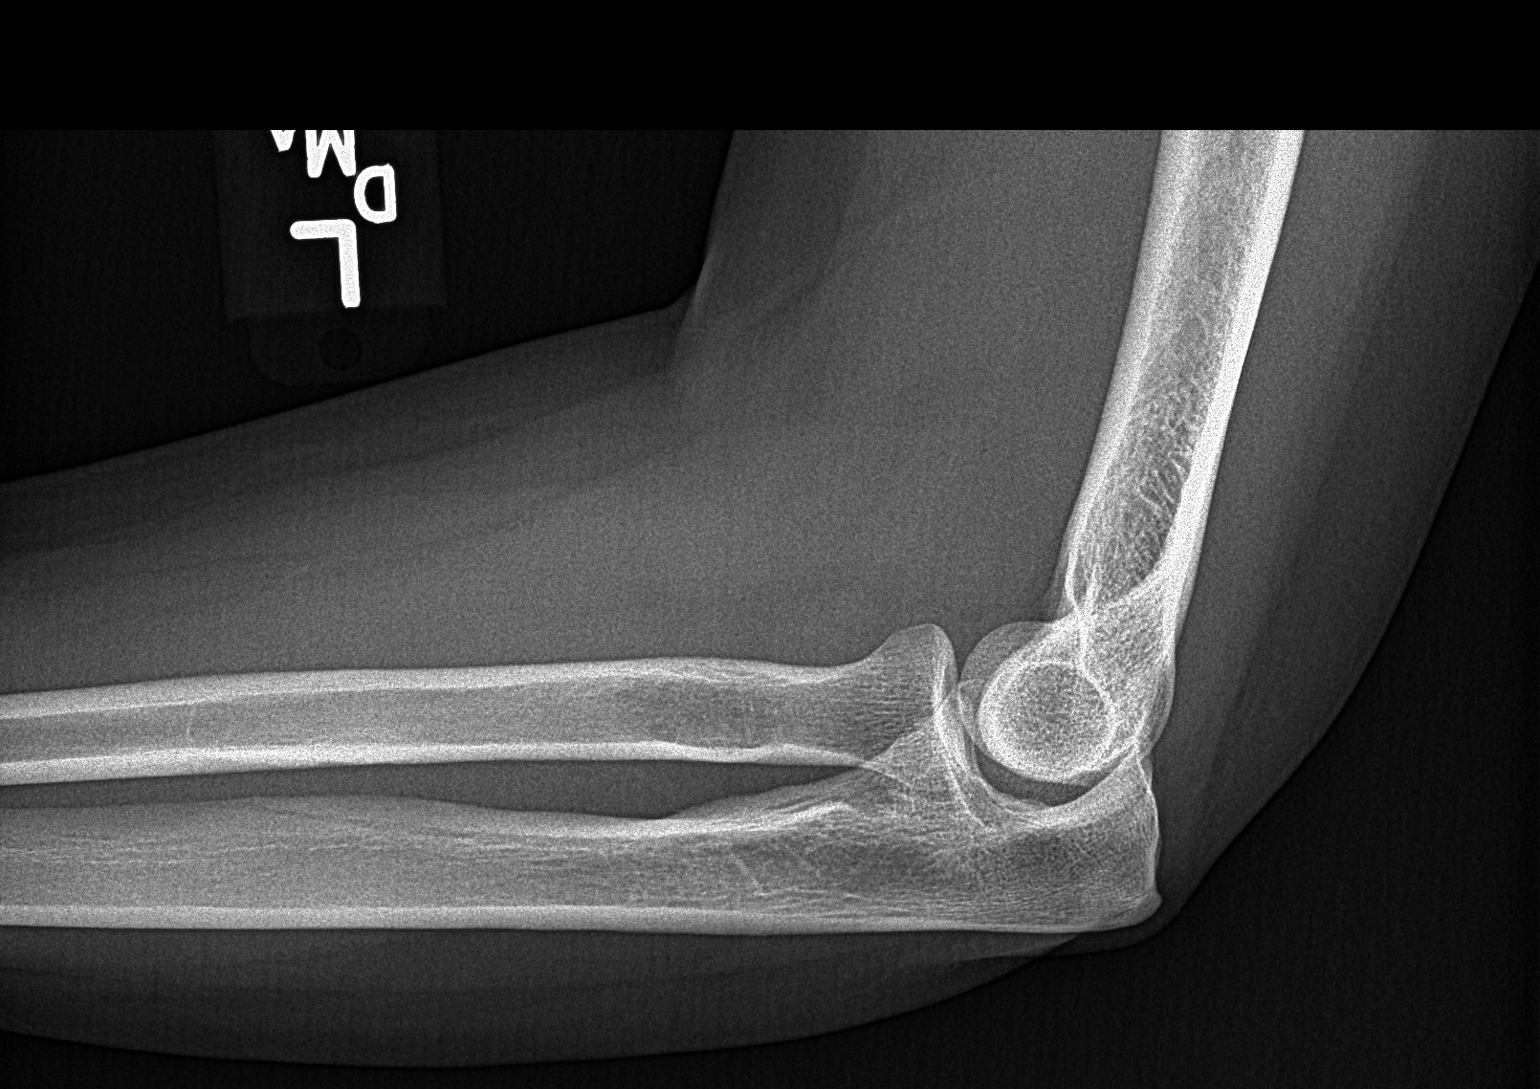
[im 2/2]
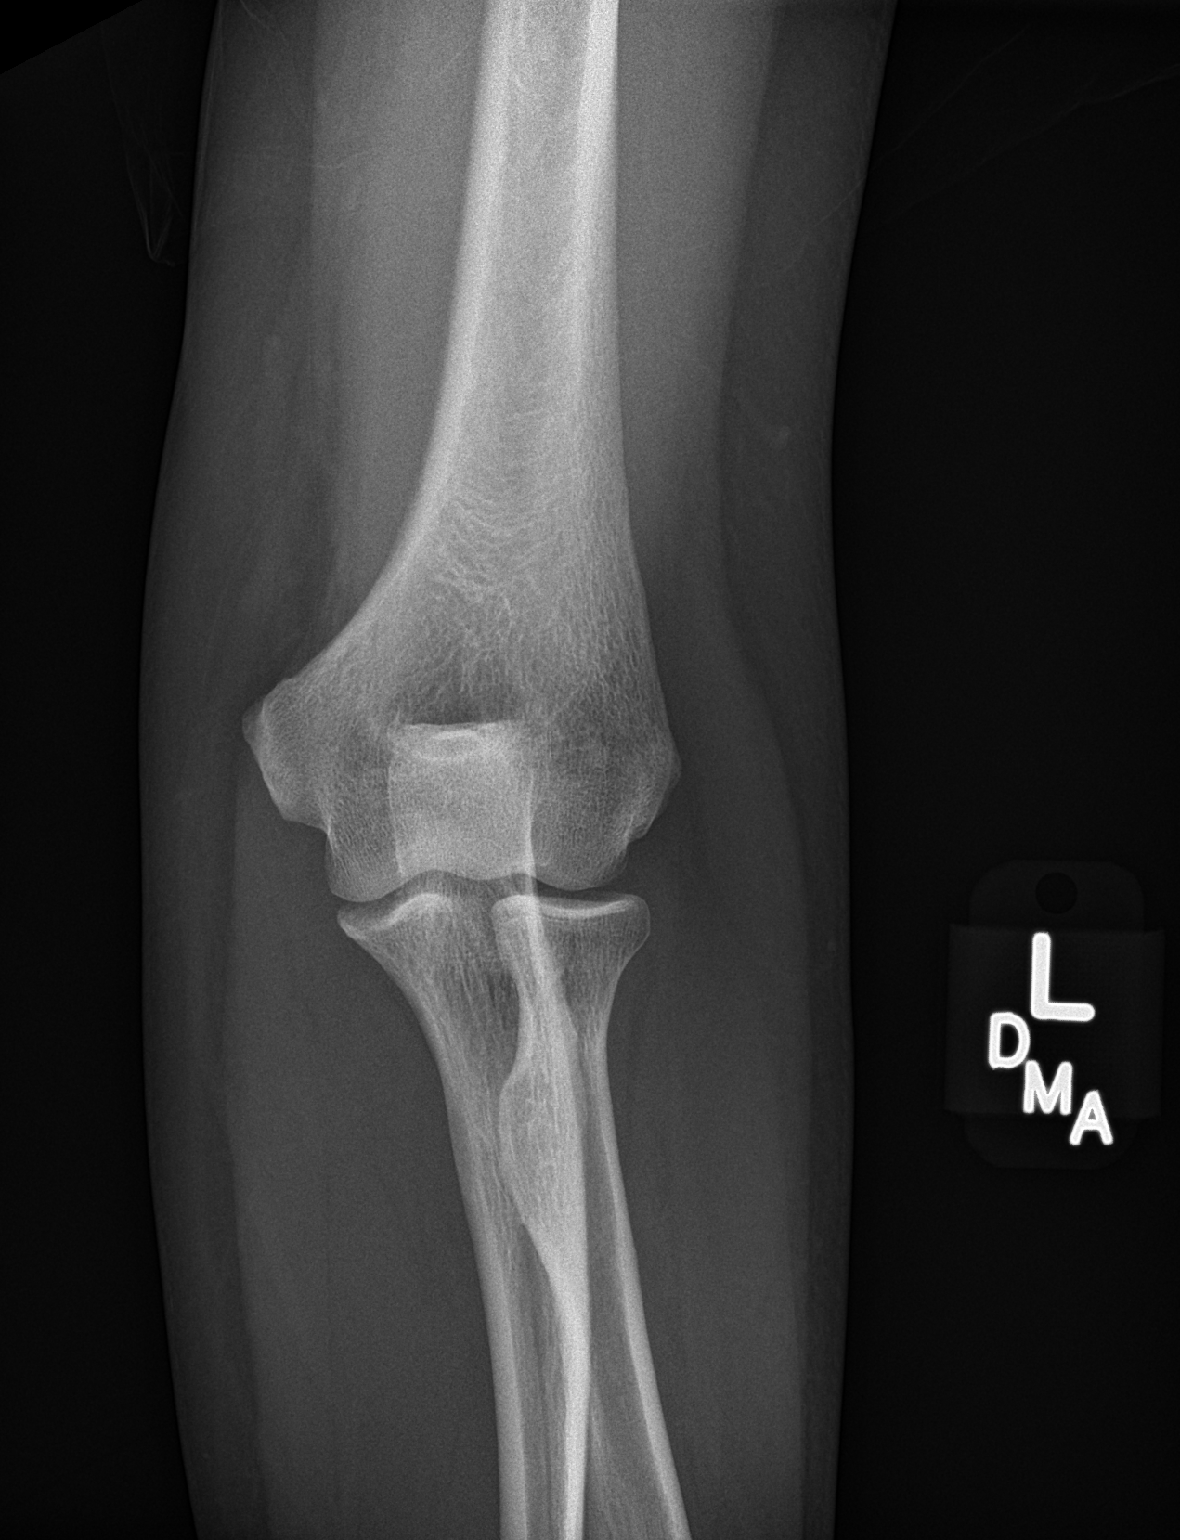

[2 of 2 positions shown; findings below may reference images not displayed]

FINDINGS: The bones of the elbow are adequately mineralized for age. There is
no acute or old fracture. There is no significant osteophyte
formation. There is a small olecranon spur. There is no joint
effusion.
IMPRESSION: There is no acute bony abnormality of the left elbow. There is a
small olecranon spur.

## 2017-08-24 ENCOUNTER — Other Ambulatory Visit (INDEPENDENT_AMBULATORY_CARE_PROVIDER_SITE_OTHER): Payer: BLUE CROSS/BLUE SHIELD

## 2017-08-24 ENCOUNTER — Ambulatory Visit (INDEPENDENT_AMBULATORY_CARE_PROVIDER_SITE_OTHER): Payer: BLUE CROSS/BLUE SHIELD | Admitting: Family Medicine

## 2017-08-24 ENCOUNTER — Encounter: Payer: Self-pay | Admitting: Family Medicine

## 2017-08-24 VITALS — BP 138/90 | HR 77 | Temp 99.1°F | Ht 69.0 in

## 2017-08-24 DIAGNOSIS — M79602 Pain in left arm: Secondary | ICD-10-CM

## 2017-08-24 DIAGNOSIS — Z0001 Encounter for general adult medical examination with abnormal findings: Secondary | ICD-10-CM

## 2017-08-24 DIAGNOSIS — M722 Plantar fascial fibromatosis: Secondary | ICD-10-CM | POA: Diagnosis not present

## 2017-08-24 DIAGNOSIS — Z9889 Other specified postprocedural states: Secondary | ICD-10-CM | POA: Diagnosis not present

## 2017-08-24 LAB — COMPREHENSIVE METABOLIC PANEL
ALK PHOS: 61 U/L (ref 39–117)
ALT: 12 U/L (ref 0–35)
AST: 18 U/L (ref 0–37)
Albumin: 4.2 g/dL (ref 3.5–5.2)
BILIRUBIN TOTAL: 0.9 mg/dL (ref 0.2–1.2)
BUN: 12 mg/dL (ref 6–23)
CO2: 27 mEq/L (ref 19–32)
Calcium: 9.4 mg/dL (ref 8.4–10.5)
Chloride: 104 mEq/L (ref 96–112)
Creatinine, Ser: 0.91 mg/dL (ref 0.40–1.20)
GFR: 83.16 mL/min (ref >60.00)
GLUCOSE: 92 mg/dL (ref 70–99)
Potassium: 3.8 mEq/L (ref 3.5–5.1)
Sodium: 139 mEq/L (ref 135–145)
TOTAL PROTEIN: 7.3 g/dL (ref 6.0–8.3)

## 2017-08-24 LAB — CBC
HCT: 35 % — ABNORMAL LOW (ref 36.0–46.0)
HEMOGLOBIN: 11.7 g/dL — AB (ref 12.0–15.0)
MCHC: 33.3 g/dL (ref 30.0–36.0)
MCV: 80.4 fl (ref 78.0–100.0)
Platelets: 220 10*3/uL (ref 150.0–400.0)
RBC: 4.36 Mil/uL (ref 3.87–5.11)
RDW: 14 % (ref 11.5–15.5)
WBC: 6.8 10*3/uL (ref 4.0–10.5)

## 2017-08-24 LAB — LIPID PANEL
Cholesterol: 203 mg/dL — ABNORMAL HIGH (ref 0–200)
HDL: 46 mg/dL (ref >39.00)
LDL Cholesterol: 141 mg/dL — ABNORMAL HIGH (ref 0–99)
NONHDL: 156.84
Total CHOL/HDL Ratio: 4
Triglycerides: 77 mg/dL (ref 0.0–149.0)
VLDL: 15.4 mg/dL (ref 0.0–40.0)

## 2017-08-24 NOTE — Progress Notes (Signed)
Subjective:  Patient ID: Elaine [name], female    DOB: 1966-01-26  Age: 52 y.o. MRN: [id number]  CC: Establish Care   HPI [facility] presents for establishment of care for follow-up of a few medical problems she has been having.  She is left-hand dominant.  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  She complains of left arm pain that moves from her upper arm into her lower arm.  There is no numbness, tingling or weakness.  She denies neck pain.  No change in strength.  She also has noticed the pain in her left heel that is worse after she has been off her feet for a while.  Noticeably worse with that first step in the morning.  She has had inserts made for this problem and they did not seem to help much.  She had a colonoscopy 2 to 3 years ago and some polyps were seen.  She does not recall follow-up recommendations.  She has never had a mammogram.  She is status post ablation and no longer has menses.  She lives with her daughter and granddaughter.  She does not smoke drink alcohol or use illicit drugs.    Outpatient Medications Prior to Visit  Medication Sig Dispense Refill  . acetaminophen (TYLENOL) 650 MG CR tablet Take 650 mg by mouth every 8 (eight) hours as needed for pain (pain).    . naproxen sodium (ANAPROX) 220 MG tablet Take 220 mg by mouth daily as needed (pain).     . traMADol (ULTRAM) 50 MG tablet Take 1 tablet (50 mg total) by mouth every 6 (six) hours as needed. 15 tablet 0  . vitamin D, CHOLECALCIFEROL, 400 UNITS tablet Take 400 Units by mouth daily.      No facility-administered medications prior to visit.     ROS Review of Systems  Constitutional: Negative for chills, fatigue, fever and unexpected weight change.  HENT: Negative.   Eyes: Negative for photophobia and visual disturbance.  Respiratory: Negative.  Negative for cough, chest tightness and shortness of breath.   Cardiovascular: Negative for chest pain and palpitations.    Gastrointestinal: Negative.   Endocrine: Negative for polyphagia and polyuria.  Genitourinary: Negative for difficulty urinating, frequency and vaginal pain.  Musculoskeletal: Negative for arthralgias, gait problem and neck pain.  Skin: Negative.   Allergic/Immunologic: Negative for immunocompromised state.  Neurological: Negative for weakness and headaches.  Hematological: Does not bruise/bleed easily.  Psychiatric/Behavioral: Negative.     Objective:  BP 138/90   Pulse 77   Temp 99.1 F (37.3 C)   Ht 5\' 9"  (1.753 m)   SpO2 99%   BMI 24.00 kg/m   BP Readings from Last 3 Encounters:  08/24/17 138/90  04/07/14 128/72  12/01/12 159/85    Wt Readings from Last 3 Encounters:  05/22/11 162 lb 8 oz (73.7 kg)    Physical Exam  Constitutional: She appears well-developed and well-nourished. No distress.  HENT:  Head: Normocephalic and atraumatic.  Right Ear: External ear normal.  Left Ear: External ear normal.  Mouth/Throat: Oropharynx is clear and moist. No oropharyngeal exudate.  Eyes: Pupils are equal, round, and reactive to light. Conjunctivae and EOM are normal. Right eye exhibits no discharge. Left eye exhibits no discharge. No scleral icterus.  Neck: Neck supple. No JVD present. No tracheal deviation present. No thyromegaly present.  Cardiovascular: Normal rate, regular rhythm and normal heart sounds.  Pulmonary/Chest: Effort normal and breath sounds normal.  Abdominal: Bowel sounds are normal. She exhibits no distension and no mass. There is no tenderness. There is no rebound and no guarding.  Musculoskeletal:       Left shoulder: She exhibits normal range of motion, no tenderness, no bony tenderness and no swelling.       Feet:  Lymphadenopathy:    She has no cervical adenopathy.  Neurological: She is alert. She has normal strength. She displays no atrophy and no tremor. She exhibits normal muscle tone. She displays no seizure activity.  Reflex Scores:       Brachioradialis reflexes are 1+ on the right side and 1+ on the left side.      Patellar reflexes are 2+ on the right side and 1+ on the left side.      Achilles reflexes are 1+ on the right side and 1+ on the left side. Skin: No rash noted. She is not diaphoretic. No erythema. No pallor.  Psychiatric: She has a normal mood and affect. Her behavior is normal.    Lab Results  Component Value Date   WBC 6.8 08/24/2017   HGB 11.7 (L) 08/24/2017   HCT 35.0 (L) 08/24/2017   PLT 220.0 08/24/2017   GLUCOSE 92 08/24/2017   CHOL 203 (H) 08/24/2017   TRIG 77.0 08/24/2017   HDL 46.00 08/24/2017   LDLCALC 141 (H) 08/24/2017   ALT 12 08/24/2017   AST 18 08/24/2017   NA 139 08/24/2017   K 3.8 08/24/2017   CL 104 08/24/2017   CREATININE 0.91 08/24/2017   BUN 12 08/24/2017   CO2 27 08/24/2017    Dg Elbow 2 Views Left  Result Date: 04/13/2014  PRIOR REPORT IMPORTED FROM AN EXTERNAL SYSTEM  CLINICAL DATA:  Lifting injury 3 weeks ago with persistent left shoulder and elbow discomfort with symptoms radiating into the hands and fingers EXAM: LEFT ELBOW - 2 VIEW COMPARISON:  None. FINDINGS: The bones of the elbow are adequately mineralized for age. There is no acute or old fracture. There is no significant osteophyte formation. There is a small olecranon spur. There is no joint effusion. IMPRESSION: There is no acute bony abnormality of the left elbow. There is a small olecranon spur. Electronically Signed   By: David  Martin[place]   On: 04/13/2014 11:27    Dg Shoulder Left  Result Date: 04/13/2014 PRIOR REPORT IMPORTED FROM AN EXTERNAL SYSTEM  CLINICAL DATA:  Lifting injury 3 weeks ago with left shoulder pain since that time; pain and numbness radiating down the arm to the elbow and into the hands and fingers EXAM: DG SHOULDER 3+VIEWS LEFT COMPARISON:  Left shoulder series of May 29, 2007 FINDINGS: The bones of the shoulder are adequately mineralized. The glenohumeral joint is normal. There is mild  degenerative change of the [facility] joint manifested by spurring and joint space narrowing. The observed portions of the left clavicle, left scapula, and upper left ribs exhibit no acute abnormalities. 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 Otherwise the examination is within the limits of normal. Electronically Signed   By: David  [place]   On: 04/13/2014 11:26     Assessment & Plan:   Elaine [name] was seen today for establish care.  Diagnoses and all orders for this visit:  Encounter for health maintenance examination with abnormal findings -     Cancel: CBC -     Cancel: Comprehensive metabolic panel -     Cancel: Lipid panel -     Urinalysis, Routine  w reflex microscopic -     Cancel: MM Digital Screening; Future -     CBC -     Comprehensive metabolic panel -     Lipid panel -     MM 3D SCREEN BREAST BILATERAL; Future  Plantar fasciitis  Arm pain, diffuse, left  History of breast lump/mass excision -     MM Digital Diagnostic Bilat; Future   I have discontinued Londen Siegman's vitamin D (CHOLECALCIFEROL), naproxen sodium, acetaminophen, and traMADol.  No orders of the defined types were placed in this encounter.  She is 6 hours fasting her labs are pending.  Encouraged her to do stretching exercises before taking the first step in the morning.  We will look for medical records on her colonoscopy that was performed 3 years ago.  She will follow-up for exams.  6/28 addendum: Was informed that patient had an excision of the left breast mass some years ago and was lost to follow-up.  It was recommended that she have a dilated diagnostic mammograms.  This was ordered today.  Follow-up: Return recommended follow up pends lab results.  Please.  Libby Maw, MD

## 2017-08-30 ENCOUNTER — Telehealth: Payer: Self-pay | Admitting: Family Medicine

## 2017-08-30 NOTE — Telephone Encounter (Signed)
Patient calling back to receive lab results. Please advise.      Copied from [place] [id number]. Topic: Quick Communication - Lab Results >> Aug 30, 2017  3:31 PM Noitamyae, Phetcharat, LPN wrote: Called patient to inform them of 08/30/17 lab results. When patient returns call, triage nurse may disclose results.

## 2017-08-31 DIAGNOSIS — Z9889 Other specified postprocedural states: Secondary | ICD-10-CM | POA: Insufficient documentation

## 2017-08-31 NOTE — Telephone Encounter (Signed)
Results given and documented in result note. 

## 2019-04-25 ENCOUNTER — Ambulatory Visit (INDEPENDENT_AMBULATORY_CARE_PROVIDER_SITE_OTHER): Payer: 59 | Admitting: Family Medicine

## 2019-04-25 ENCOUNTER — Encounter: Payer: BLUE CROSS/BLUE SHIELD | Admitting: Family Medicine

## 2019-04-25 ENCOUNTER — Encounter: Payer: Self-pay | Admitting: Family Medicine

## 2019-04-25 ENCOUNTER — Other Ambulatory Visit: Payer: Self-pay

## 2019-04-25 VITALS — BP 140/88 | HR 79 | Temp 98.0°F | Ht 70.0 in | Wt 178.0 lb

## 2019-04-25 DIAGNOSIS — D649 Anemia, unspecified: Secondary | ICD-10-CM | POA: Diagnosis not present

## 2019-04-25 DIAGNOSIS — Z0000 Encounter for general adult medical examination without abnormal findings: Secondary | ICD-10-CM | POA: Diagnosis not present

## 2019-04-25 DIAGNOSIS — S46911A Strain of unspecified muscle, fascia and tendon at shoulder and upper arm level, right arm, initial encounter: Secondary | ICD-10-CM | POA: Diagnosis not present

## 2019-04-25 NOTE — Patient Instructions (Addendum)
Health Maintenance Due  Topic Date Due  . HIV Screening  08/17/1979  . TETANUS/TDAP  08/17/1983  . PAP SMEAR-Modifier  08/16/1985  . COLONOSCOPY  08/17/2014  . MAMMOGRAM  12/30/2015  . INFLUENZA VACCINE  10/05/2018    No flowsheet data found.  Health Maintenance, Female Adopting a healthy lifestyle and getting preventive care are important in promoting health and wellness. Ask your health care provider about:  The right schedule for you to have regular tests and exams.  Things you can do on your own to prevent diseases and keep yourself healthy. What should I know about diet, weight, and exercise? Eat a healthy diet   Eat a diet that includes plenty of vegetables, fruits, low-fat dairy products, and lean protein.  Do not eat a lot of foods that are high in solid fats, added sugars, or sodium. Maintain a healthy weight Body mass index (BMI) is used to identify weight problems. It estimates body fat based on height and weight. Your health care provider can help determine your BMI and help you achieve or maintain a healthy weight. Get regular exercise Get regular exercise. This is one of the most important things you can do for your health. Most adults should:  Exercise for at least 150 minutes each week. The exercise should increase your heart rate and make you sweat (moderate-intensity exercise).  Do strengthening exercises at least twice a week. This is in addition to the moderate-intensity exercise.  Spend less time sitting. Even light physical activity can be beneficial. Watch cholesterol and blood lipids Have your blood tested for lipids and cholesterol at 55 years of age, then have this test every 5 years. Have your cholesterol levels checked more often if:  Your lipid or cholesterol levels are high.  You are older than 55 years of age.  You are at high risk for heart disease. What should I know about cancer screening? Depending on your health history and family  history, you may need to have cancer screening at various ages. This may include screening for:  Breast cancer.  Cervical cancer.  Colorectal cancer.  Skin cancer.  Lung cancer. What should I know about heart disease, diabetes, and high blood pressure? Blood pressure and heart disease  High blood pressure causes heart disease and increases the risk of stroke. This is more likely to develop in people who have high blood pressure readings, are of African descent, or are overweight.  Have your blood pressure checked: ? Every 3-5 years if you are 66-60 years of age. ? Every year if you are 72 years old or older. Diabetes Have regular diabetes screenings. This checks your fasting blood sugar level. Have the screening done:  Once every three years after age 69 if you are at a normal weight and have a low risk for diabetes.  More often and at a younger age if you are overweight or have a high risk for diabetes. What should I know about preventing infection? Hepatitis B If you have a higher risk for hepatitis B, you should be screened for this virus. Talk with your health care provider to find out if you are at risk for hepatitis B infection. Hepatitis C Testing is recommended for:  Everyone born from [birth year] through 1966.  Anyone with known risk factors for hepatitis C. Sexually transmitted infections (STIs)  Get screened for STIs, including gonorrhea and chlamydia, if: ? You are sexually active and are younger than 55 years of age. ? You are older than  55 years of age and your health care provider tells you that you are at risk for this type of infection. ? Your sexual activity has changed since you were last screened, and you are at increased risk for chlamydia or gonorrhea. Ask your health care provider if you are at risk.  Ask your health care provider about whether you are at high risk for HIV. Your health care provider may recommend a prescription medicine to help prevent HIV  infection. If you choose to take medicine to prevent HIV, you should first get tested for HIV. You should then be tested every 3 months for as long as you are taking the medicine. Pregnancy  If you are about to stop having your period (premenopausal) and you may become pregnant, seek counseling before you get pregnant.  Take 400 to 800 micrograms (mcg) of folic acid every day if you become pregnant.  Ask for birth control (contraception) if you want to prevent pregnancy. Osteoporosis and menopause Osteoporosis is a disease in which the bones lose minerals and strength with aging. This can result in bone fractures. If you are 14 years old or older, or if you are at risk for osteoporosis and fractures, ask your health care provider if you should:  Be screened for bone loss.  Take a calcium or vitamin D supplement to lower your risk of fractures.  Be given hormone replacement therapy (HRT) to treat symptoms of menopause. Follow these instructions at home: Lifestyle  Do not use any products that contain nicotine or tobacco, such as cigarettes, e-cigarettes, and chewing tobacco. If you need help quitting, ask your health care provider.  Do not use street drugs.  Do not share needles.  Ask your health care provider for help if you need support or information about quitting drugs. Alcohol use  Do not drink alcohol if: ? Your health care provider tells you not to drink. ? You are pregnant, may be pregnant, or are planning to become pregnant.  If you drink alcohol: ? Limit how much you use to 0-1 drink a day. ? Limit intake if you are breastfeeding.  Be aware of how much alcohol is in your drink. In the [place] one drink equals one 12 oz bottle of beer (355 mL), one 5 oz glass of wine (148 mL), or one 1 oz glass of hard liquor (44 mL). General instructions  Schedule regular health, dental, and eye exams.  Stay current with your vaccines.  Tell your health care provider if: ? You  often feel depressed. ? You have ever been abused or do not feel safe at home. Summary  Adopting a healthy lifestyle and getting preventive care are important in promoting health and wellness.  Follow your health care provider's instructions about healthy diet, exercising, and getting tested or screened for diseases.  Follow your health care provider's instructions on monitoring your cholesterol and blood pressure. This information is not intended to replace advice given to you by your health care provider. Make sure you discuss any questions you have with your health care provider. Document Revised: 02/13/2018 Document Reviewed: 02/13/2018 Elsevier Patient Education  2020 [employer]  Preventive Care 9-24 Years Old, Female Preventive care refers to visits with your health care provider and lifestyle choices that can promote health and wellness. This includes:  A yearly physical exam. This may also be called an annual well check.  Regular dental visits and eye exams.  Immunizations.  Screening for certain conditions.  Healthy lifestyle choices, such  as eating a healthy diet, getting regular exercise, not using drugs or products that contain nicotine and tobacco, and limiting alcohol use. What can I expect for my preventive care visit? Physical exam Your health care provider will check your:  Height and weight. This may be used to calculate body mass index (BMI), which tells if you are at a healthy weight.  Heart rate and blood pressure.  Skin for abnormal spots. Counseling Your health care provider may ask you questions about your:  Alcohol, tobacco, and drug use.  Emotional well-being.  Home and relationship well-being.  Sexual activity.  Eating habits.  Work and work Statistician.  Method of birth control.  Menstrual cycle.  Pregnancy history. What immunizations do I need?  Influenza (flu) vaccine  This is recommended every year. Tetanus, diphtheria,  and pertussis (Tdap) vaccine  You may need a Td booster every 10 years. Varicella (chickenpox) vaccine  You may need this if you have not been vaccinated. Zoster (shingles) vaccine  You may need this after age 48. Measles, mumps, and rubella (MMR) vaccine  You may need at least one dose of MMR if you were born in 1957 or later. You may also need a second dose. Pneumococcal conjugate (PCV13) vaccine  You may need this if you have certain conditions and were not previously vaccinated. Pneumococcal polysaccharide (PPSV23) vaccine  You may need one or two doses if you smoke cigarettes or if you have certain conditions. Meningococcal conjugate (MenACWY) vaccine  You may need this if you have certain conditions. Hepatitis A vaccine  You may need this if you have certain conditions or if you travel or work in places where you may be exposed to hepatitis A. Hepatitis B vaccine  You may need this if you have certain conditions or if you travel or work in places where you may be exposed to hepatitis B. Haemophilus influenzae type b (Hib) vaccine  You may need this if you have certain conditions. Human papillomavirus (HPV) vaccine  If recommended by your health care provider, you may need three doses over 6 months. You may receive vaccines as individual doses or as more than one vaccine together in one shot (combination vaccines). Talk with your health care provider about the risks and benefits of combination vaccines. What tests do I need? Blood tests  Lipid and cholesterol levels. These may be checked every 5 years, or more frequently if you are over 9 years old.  Hepatitis C test.  Hepatitis B test. Screening  Lung cancer screening. You may have this screening every year starting at age 65 if you have a 30-pack-year history of smoking and currently smoke or have quit within the past 15 years.  Colorectal cancer screening. All adults should have this screening starting at age 69  and continuing until age 38. Your health care provider may recommend screening at age 45 if you are at increased risk. You will have tests every 1-10 years, depending on your results and the type of screening test.  Diabetes screening. This is done by checking your blood sugar (glucose) after you have not eaten for a while (fasting). You may have this done every 1-3 years.  Mammogram. This may be done every 1-2 years. Talk with your health care provider about when you should start having regular mammograms. This may depend on whether you have a family history of breast cancer.  BRCA-related cancer screening. This may be done if you have a family history of breast, ovarian, tubal, or  peritoneal cancers.  Pelvic exam and Pap test. This may be done every 3 years starting at age 52. Starting at age 53, this may be done every 5 years if you have a Pap test in combination with an HPV test. Other tests  Sexually transmitted disease (STD) testing.  Bone density scan. This is done to screen for osteoporosis. You may have this scan if you are at high risk for osteoporosis. Follow these instructions at home: Eating and drinking  Eat a diet that includes fresh fruits and vegetables, whole grains, lean protein, and low-fat dairy.  Take vitamin and mineral supplements as recommended by your health care provider.  Do not drink alcohol if: ? Your health care provider tells you not to drink. ? You are pregnant, may be pregnant, or are planning to become pregnant.  If you drink alcohol: ? Limit how much you have to 0-1 drink a day. ? Be aware of how much alcohol is in your drink. In the [place] one drink equals one 12 oz bottle of beer (355 mL), one 5 oz glass of wine (148 mL), or one 1 oz glass of hard liquor (44 mL). Lifestyle  Take daily care of your teeth and gums.  Stay active. Exercise for at least 30 minutes on 5 or more days each week.  Do not use any products that contain nicotine or tobacco,  such as cigarettes, e-cigarettes, and chewing tobacco. If you need help quitting, ask your health care provider.  If you are sexually active, practice safe sex. Use a condom or other form of birth control (contraception) in order to prevent pregnancy and STIs (sexually transmitted infections).  If told by your health care provider, take low-dose aspirin daily starting at age 32. What's next?  Visit your health care provider once a year for a well check visit.  Ask your health care provider how often you should have your eyes and teeth checked.  Stay up to date on all vaccines. This information is not intended to replace advice given to you by your health care provider. Make sure you discuss any questions you have with your health care provider. Document Revised: 11/01/2017 Document Reviewed: 11/01/2017 Elsevier Patient Education  [place].  Preventing Hypertension Hypertension, commonly called high blood pressure, is when the force of blood pumping through the arteries is too strong. Arteries are blood vessels that carry blood from the heart throughout the body. Over time, hypertension can damage the arteries and decrease blood flow to important parts of the body, including the brain, heart, and kidneys. Often, hypertension does not cause symptoms until blood pressure is very high. For this reason, it is important to have your blood pressure checked on a regular basis. Hypertension can often be prevented with diet and lifestyle changes. If you already have hypertension, you can control it with diet and lifestyle changes, as well as medicine. What nutrition changes can be made? Maintain a healthy diet. This includes:  Eating less salt (sodium). Ask your health care provider how much sodium is safe for you to have. The general recommendation is to consume less than 1 tsp (2,300 mg) of sodium a day. ? Do not add salt to your food. ? Choose low-sodium options when grocery shopping and  eating out.  Limiting fats in your diet. You can do this by eating low-fat or fat-free dairy products and by eating less red meat.  Eating more fruits, vegetables, and whole grains. Make a goal to eat: ? 1-2  cups of fresh fruits and vegetables each day. ? 3-4 servings of whole grains each day.  Avoiding foods and beverages that have added sugars.  Eating fish that contain healthy fats (omega-3 fatty acids), such as mackerel or salmon. If you need help putting together a healthy eating plan, try the DASH diet. This diet is high in fruits, vegetables, and whole grains. It is low in sodium, red meat, and added sugars. DASH stands for Dietary Approaches to Stop Hypertension. What lifestyle changes can be made?   Lose weight if you are overweight. Losing just 3?5% of your body weight can help prevent or control hypertension. ? For example, if your present weight is 200 lb (91 kg), a loss of 3-5% of your weight means losing 6-10 lb (2.7-4.5 kg). ? Ask your health care provider to help you with a diet and exercise plan to safely lose weight.  Get enough exercise. Do at least 150 minutes of moderate-intensity exercise each week. ? You could do this in short exercise sessions several times a day, or you could do longer exercise sessions a few times a week. For example, you could take a brisk 10-minute walk or bike ride, 3 times a day, for 5 days a week.  Find ways to reduce stress, such as exercising, meditating, listening to music, or taking a yoga class. If you need help reducing stress, ask your health care provider.  Do not smoke. This includes e-cigarettes. Chemicals in tobacco and nicotine products raise your blood pressure each time you smoke. If you need help quitting, ask your health care provider.  Avoid alcohol. If you drink alcohol, limit alcohol intake to no more than 1 drink a day for nonpregnant women and 2 drinks a day for men. One drink equals 12 oz of beer, 5 oz of wine, or 1 oz  of hard liquor. Why are these changes important? Diet and lifestyle changes can help you prevent hypertension, and they may make you feel better overall and improve your quality of life. If you have hypertension, making these changes will help you control it and help prevent major complications, such as:  Hardening and narrowing of arteries that supply blood to: ? Your heart. This can cause a heart attack. ? Your brain. This can cause a stroke. ? Your kidneys. This can cause kidney failure.  Stress on your heart muscle, which can cause heart failure. What can I do to lower my risk?  Work with your health care provider to make a hypertension prevention plan that works for you. Follow your plan and keep all follow-up visits as told by your health care provider.  Learn how to check your blood pressure at home. Make sure that you know your personal target blood pressure, as told by your health care provider. How is this treated? In addition to diet and lifestyle changes, your health care provider may recommend medicines to help lower your blood pressure. You may need to try a few different medicines to find what works best for you. You also may need to take more than one medicine. Take over-the-counter and prescription medicines only as told by your health care provider. Where to find support Your health care provider can help you prevent hypertension and help you keep your blood pressure at a healthy level. Your local hospital or your community may also provide support services and prevention programs. The American Heart Association offers an online support network at: CheapBootlegs.com.cy Where to find more information Learn more about hypertension  from:  [place]: ElectronicHangman.is  Centers for Disease Control and Prevention: https://ingram.com/  American Academy of Family Physicians:  http://familydoctor.org/familydoctor/en/diseases-conditions/high-blood-pressure.printerview.all.html Learn more about the DASH diet from:  [place] [place]: https://www.reyes.com/ Contact a health care provider if:  You think you are having a reaction to medicines you have taken.  You have recurrent headaches or feel dizzy.  You have swelling in your ankles.  You have trouble with your vision. Summary  Hypertension often does not cause any symptoms until blood pressure is very high. It is important to get your blood pressure checked regularly.  Diet and lifestyle changes are the most important steps in preventing hypertension.  By keeping your blood pressure in a healthy range, you can prevent complications like heart attack, heart failure, stroke, and kidney failure.  Work with your health care provider to make a hypertension prevention plan that works for you. This information is not intended to replace advice given to you by your health care provider. Make sure you discuss any questions you have with your health care provider. Document Revised: 06/14/2018 Document Reviewed: 11/01/2015 Elsevier Patient Education  2020 [employer].
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
## 2019-05-01 ENCOUNTER — Other Ambulatory Visit: Payer: Self-pay

## 2019-05-02 ENCOUNTER — Ambulatory Visit: Payer: Self-pay

## 2019-05-02 ENCOUNTER — Ambulatory Visit (INDEPENDENT_AMBULATORY_CARE_PROVIDER_SITE_OTHER): Payer: 59 | Admitting: Family Medicine

## 2019-05-02 ENCOUNTER — Other Ambulatory Visit (INDEPENDENT_AMBULATORY_CARE_PROVIDER_SITE_OTHER): Payer: 59

## 2019-05-02 ENCOUNTER — Encounter: Payer: Self-pay | Admitting: Family Medicine

## 2019-05-02 VITALS — BP 140/90 | HR 77 | Ht 70.0 in | Wt 176.0 lb

## 2019-05-02 DIAGNOSIS — Z0000 Encounter for general adult medical examination without abnormal findings: Secondary | ICD-10-CM | POA: Diagnosis not present

## 2019-05-02 DIAGNOSIS — M25511 Pain in right shoulder: Secondary | ICD-10-CM

## 2019-05-02 DIAGNOSIS — D649 Anemia, unspecified: Secondary | ICD-10-CM

## 2019-05-02 LAB — LIPID PANEL
Cholesterol: 204 mg/dL — ABNORMAL HIGH (ref 0–200)
HDL: 45.6 mg/dL (ref >39.00)
LDL Cholesterol: 145 mg/dL — ABNORMAL HIGH (ref 0–99)
NonHDL: 158.81
Total CHOL/HDL Ratio: 4
Triglycerides: 68 mg/dL (ref 0.0–149.0)
VLDL: 13.6 mg/dL (ref 0.0–40.0)

## 2019-05-02 LAB — CBC
HCT: 35.9 % — ABNORMAL LOW (ref 36.0–46.0)
Hemoglobin: 11.6 g/dL — ABNORMAL LOW (ref 12.0–15.0)
MCHC: 32.3 g/dL (ref 30.0–36.0)
MCV: 81.3 fl (ref 78.0–100.0)
Platelets: 218 10*3/uL (ref 150.0–400.0)
RBC: 4.42 Mil/uL (ref 3.87–5.11)
RDW: 13.8 % (ref 11.5–15.5)
WBC: 9.2 10*3/uL (ref 4.0–10.5)

## 2019-05-02 LAB — URINALYSIS, ROUTINE W REFLEX MICROSCOPIC
Bilirubin Urine: NEGATIVE
Ketones, ur: NEGATIVE
Nitrite: NEGATIVE
Specific Gravity, Urine: 1.02 (ref 1.000–1.030)
Total Protein, Urine: NEGATIVE
Urine Glucose: NEGATIVE
Urobilinogen, UA: 0.2 (ref 0.0–1.0)
pH: 6 (ref 5.0–8.0)

## 2019-05-02 LAB — COMPREHENSIVE METABOLIC PANEL
ALT: 19 U/L (ref 0–35)
AST: 30 U/L (ref 0–37)
Albumin: 4.3 g/dL (ref 3.5–5.2)
Alkaline Phosphatase: 75 U/L (ref 39–117)
BUN: 13 mg/dL (ref 6–23)
CO2: 29 mEq/L (ref 19–32)
Calcium: 10.1 mg/dL (ref 8.4–10.5)
Chloride: 104 mEq/L (ref 96–112)
Creatinine, Ser: 0.9 mg/dL (ref 0.40–1.20)
GFR: 78.74 mL/min (ref >60.00)
Glucose, Bld: 112 mg/dL — ABNORMAL HIGH (ref 70–99)
Potassium: 4.5 mEq/L (ref 3.5–5.1)
Sodium: 141 mEq/L (ref 135–145)
Total Bilirubin: 0.8 mg/dL (ref 0.2–1.2)
Total Protein: 7.8 g/dL (ref 6.0–8.3)

## 2019-05-02 LAB — VITAMIN B12: Vitamin B-12: 226 pg/mL (ref 211–911)

## 2019-05-02 LAB — TSH: TSH: 1.07 u[IU]/mL (ref 0.35–4.50)

## 2019-05-02 NOTE — Patient Instructions (Addendum)
Thank you for coming in today.  Call or go to the ER if you develop a large red swollen joint with extreme pain or oozing puss.   Do the exercises we discussed.  Attend PT.  Recheck in about 6 weeks.  Return or contact me sooner if needed.   Please perform the exercise program that we have prepared for you and gone over in detail on a daily basis.  In addition to the handout you were provided you can access your program through: [email or web address]   Your unique program code is:  [id number]

## 2019-05-02 NOTE — Progress Notes (Signed)
Subjective:    I'm seeing this patient as a consultation for:  Dr. Ethelene Hal. Note will be routed back to referring provider/PCP.  CC: R shoulder pain  I, Molly Weber, LAT, ATC, am serving as scribe for Dr. Lynne Leader.  HPI: Pt is a 54 y/o female presenting w/ c/o R shoulder pain x one month w/ no specific MOI.  She locates the pain to her R shoulder and lateral upper arm.   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  She denies neck pain, radiating R UE pain, weakness or numbness/tingling into the R UE. Rates pain 7/10 sharp pain that comes and goes has to sleep on a pillow under her shoulder to be able to sleep.  Aggravating factors: none Treatments tried: icy hot, ice and heat, and aleve   Past medical history, Surgical history, Family history, Social history, Allergies, and medications have been entered into the medical record, reviewed.   Review of Systems: No new headache, visual changes, nausea, vomiting, diarrhea, constipation, dizziness, abdominal pain, skin rash, fevers, chills, night sweats, weight loss, swollen lymph nodes, body aches, joint swelling, muscle aches, chest pain, shortness of breath, mood changes, visual or auditory hallucinations.   Objective:    Vitals:   05/02/19 0936  BP: 140/90  Pulse: 77  SpO2: 99%   General: Well Developed, well nourished, and in no acute distress.  Neuro/Psych: Alert and oriented x3, extra-ocular muscles intact, able to move all 4 extremities, sensation grossly intact. Skin: Warm and dry, no rashes noted.  Respiratory: Not using accessory muscles, speaking in full sentences, trachea midline.  Cardiovascular: Pulses palpable, no extremity edema. Abdomen: Does not appear distended. MSK: Right shoulder Normal-appearing nontender. Normal motion however pain with abduction beyond 120 degrees.  Normal abduction external and internal  rotation. Strength: 4/5 abduction with pain.  5/5 internal and external rotation without pain. Positive empty can test. Positive Hawkins and Neer's test. Positive speeds test negative Yergason's test. Negative crossover arm compression test.  Contralateral left shoulder normal-appearing nontender normal motion normal strength negative impingement testing.  Pulses cap refill and sensation intact bilateral upper extremities.  Lab and Radiology Results Diagnostic Limited MSK Ultrasound of: Right shoulder Biceps tendon normal-appearing intact in bicipital groove. Subscapularis tendon normal-appearing. Supraspinatus tendon intact without tear.  Increased subacromial bursa thickness present. Infraspinatus tendon normal-appearing. AC joint mild effusion. Impression: Subacromial bursitis  Procedure: Real-time Ultrasound Guided Injection of right shoulder subacromial bursa Device: Philips Affiniti 50G Images permanently stored and available for review in the ultrasound unit. Verbal informed consent obtained.  Discussed risks and benefits of procedure. Warned about infection bleeding damage to structures skin hypopigmentation and fat atrophy among others. Patient expresses understanding and agreement Time-out conducted.   Noted no overlying erythema, induration, or other signs of local infection.   Skin prepped in a sterile fashion.   Local anesthesia: Topical Ethyl chloride.   With sterile technique and under real time ultrasound guidance:  40 mg of Kenalog and 2 mL of Marcaine injected easily.   Completed without difficulty   Pain immediately resolved suggesting accurate placement of the medication.   Advised to call if fevers/chills, erythema, induration, drainage, or persistent bleeding.   Images permanently stored and available for review in the ultrasound unit.  Impression: Technically successful ultrasound guided injection.        Impression and Recommendations:    Assessment  and Plan:  54 y.o. female with right shoulder pain ongoing for 3 months.  Exam and ultrasound consistent with shoulder impingement/bursitis/tendinitis.  Plan for home exercise program as taught in clinic today by ATC, referral to physical therapy, and subacromial bursa injection as above.  Recheck back in about 6 weeks.  Return sooner if needed if not improved. Next step if not better would be x-ray and MRI for surgical planning.   Orders Placed This Encounter  Procedures  . [place] LIMITED JOINT SPACE STRUCTURES UP RIGHT(NO LINKED CHARGES)    Order Specific Question:   Reason for Exam (SYMPTOM  OR DIAGNOSIS REQUIRED)    Answer:   R shoulder pain    Order Specific Question:   Preferred imaging location?    Answer:   [place]  . Ambulatory referral to Physical Therapy    Referral Priority:   Routine    Referral Type:   Physical Medicine    Referral Reason:   Specialty Services Required    Requested Specialty:   Physical Therapy   No orders of the defined types were placed in this encounter.   Discussed warning signs or symptoms. Please see discharge instructions. Patient expresses understanding.   The above documentation has been reviewed and is accurate and complete Lynne Leader

## 2019-05-03 LAB — IRON,TIBC AND FERRITIN PANEL
%SAT: 17 % (calc) (ref 16–45)
Ferritin: 69 ng/mL (ref 16–232)
Iron: 63 ug/dL (ref 45–160)
TIBC: 371 mcg/dL (calc) (ref 250–450)

## 2019-05-03 LAB — HIV ANTIBODY (ROUTINE TESTING W REFLEX): HIV 1&2 Ab, 4th Generation: NONREACTIVE

## 2019-05-09 ENCOUNTER — Other Ambulatory Visit (HOSPITAL_COMMUNITY)
Admission: RE | Admit: 2019-05-09 | Discharge: 2019-05-09 | Disposition: A | Discharge status: Home / Self Care | Payer: 59 | Source: Ambulatory Visit | Attending: Obstetrics & Gynecology | Admitting: Obstetrics & Gynecology

## 2019-05-09 ENCOUNTER — Other Ambulatory Visit: Payer: Self-pay

## 2019-05-09 ENCOUNTER — Encounter: Payer: Self-pay | Admitting: Certified Nurse Midwife

## 2019-05-09 ENCOUNTER — Ambulatory Visit: Payer: 59 | Admitting: Certified Nurse Midwife

## 2019-05-09 VITALS — BP 120/78 | HR 70 | Temp 98.2°F | Resp 16 | Ht 69.25 in | Wt 174.0 lb

## 2019-05-09 DIAGNOSIS — Z124 Encounter for screening for malignant neoplasm of cervix: Secondary | ICD-10-CM

## 2019-05-09 DIAGNOSIS — N951 Menopausal and female climacteric states: Secondary | ICD-10-CM | POA: Diagnosis not present

## 2019-05-09 DIAGNOSIS — M5412 Radiculopathy, cervical region: Secondary | ICD-10-CM | POA: Insufficient documentation

## 2019-05-09 DIAGNOSIS — Z01419 Encounter for gynecological examination (general) (routine) without abnormal findings: Secondary | ICD-10-CM | POA: Diagnosis not present

## 2019-05-09 NOTE — Progress Notes (Signed)
54 y.o. [id number] Legally Separated  African American Fe here to establish gyn care and  for annual exam. Has not had Gyn exam in about 4 years. Menopausal ? Denies any hot flashes or night sweats noted. Denies vaginal dryness. Had serum STD screening with PCP. Patient has had not vaginal bleeding or spotting since ablation approximate 15 years for heavy periods and anemia. History of left breast biopsy for mass and was benign. No mammogram in 6 years. No other health issues today.  No LMP recorded. Patient has had an ablation.          Sexually active: No. in the past 4 years. The current method of family planning is tubal ligation, ablation.    Exercising: Yes.    walking Smoker:  no  Review of Systems  Constitutional: Negative.   HENT: Negative.   Eyes: Negative.   Respiratory: Negative.   Cardiovascular: Negative.   Gastrointestinal: Negative.   Genitourinary: Negative.   Musculoskeletal: Negative.   Skin: Negative.   Neurological: Negative.   Endo/Heme/Allergies: Negative.   Psychiatric/Behavioral: Negative.     Health Maintenance: Pap:  45yrs ago neg per patient History of Abnormal Pap: no MMG:  12-29-13 bilateral & left breast u/s category c density birads 3:probably benign Self Breast exams: yes Colonoscopy: maybe 28yrs ago BMD:   none TDaP:  2013 Shingles: not done Pneumonia: not done Hep C and HIV: HIV neg 2021 Labs: yes   reports that she has never smoked. She has never used smokeless tobacco. She reports that she does not drink alcohol or use drugs.  Past Medical History:  Diagnosis Date  . Anemia   . Endometriosis   . Frequent headaches   . Hypertension    not on medication     Past Surgical History:  Procedure Laterality Date  . ABLATION ON ENDOMETRIOSIS    . BREAST BIOPSY  04/19/11  . HERNIA REPAIR     umbilical    Current Outpatient Medications  Medication Sig Dispense Refill  . Multiple Vitamin (MULTIVITAMIN PO) Take by mouth.    . Naproxen Sodium  (ALEVE) 220 MG CAPS Aleve     No current facility-administered medications for this visit.    Family History  Problem Relation Age of Onset  . Hypertension Mother   . Diabetes Mother   . Heart disease Mother   . Cancer Father   . Cancer Sister   . Pancreatic cancer Sister   . Hypertension Sister   . Hypertension Sister   . Hypertension Sister     ROS:  Pertinent items are noted in HPI.  Otherwise, a comprehensive ROS was negative.  Exam:   BP 120/78   Pulse 70   Temp 98.2 F (36.8 C) (Skin)   Resp 16   Ht 5' 9.25" (1.759 m)   Wt 174 lb (78.9 kg)   BMI 25.51 kg/m  Height: 5' 9.25" (175.9 cm) Ht Readings from Last 3 Encounters:  05/09/19 5' 9.25" (1.759 m)  05/02/19 5\' 10"  (1.778 m)  04/25/19 5\' 10"  (1.778 m)    General appearance: alert, cooperative and appears stated age Head: Normocephalic, without obvious abnormality, atraumatic Neck: no adenopathy, supple, symmetrical, trachea midline and thyroid normal to inspection and palpation Lungs: clear to auscultation bilaterally Breasts: normal appearance, no masses or tenderness, No nipple retraction or dimpling, No nipple discharge or bleeding, No axillary or supraclavicular adenopathy, scar on left breast from biopsy noted Heart: regular rate and rhythm Abdomen: soft, non-tender; no masses,  no  organomegaly,scarring from previous umbilical hernia repair and BTL noted, no masses Extremities: extremities normal, atraumatic, no cyanosis or edema Skin: Skin color, texture, turgor normal. No rashes or lesions Lymph nodes: Cervical, supraclavicular, and axillary nodes normal. No abnormal inguinal nodes palpated Neurologic: Grossly normal   Pelvic: External genitalia:  no lesions, normal female              Urethra:  normal appearing urethra with no masses, tenderness or lesions              Bartholin's and Skene's: normal                 Vagina: normal appearing vagina with normal color and discharge, no lesions               Cervix: anteverted, multiparous appearance, no cervical motion tenderness and no lesions              Pap taken: Yes.   Bimanual Exam:  Uterus:  normal size, contour, position, consistency, mobility, non-tender and anteverted              Adnexa: normal adnexa and no mass, fullness, tenderness               Rectovaginal: Confirms               Anus:  normal sphincter tone, no lesions  Chaperone present: yes  A:  Well Woman with normal exam  Amenorrhea ? Menopausal, not symptomatic  History of endometrial ablation 15 years ago  History of left breast mass with benign biopsy per patient  History of umbilical hernia repair and BTL  Colonoscopy due, patient has scheduled, previous polyp  P:   Reviewed health and wellness pertinent to exam  Discussed even though asymptomatic she probably is menopausal. Recommend lab to confirm, patient agreeable.  Lab: [facility]  Discussed even though she has had ablation if she has any bleeding this is a concern and she needs to call and come in.  Continue yearly mammograms as recommended  Discussed abdominal appearance from scarring and do not feel there is concern. She needs to continue follow up with PCP.  Pap smear: yes   counseled on breast self exam, mammography screening, STD prevention, HIV risk factors and prevention, feminine hygiene, menopause, adequate intake of calcium and vitamin D, diet and exercise  return annually or prn  An After Visit Summary was printed and given to the patient.

## 2019-05-09 NOTE — Patient Instructions (Signed)

## 2019-05-10 LAB — FOLLICLE STIMULATING HORMONE: FSH: 60.4 m[IU]/mL

## 2019-05-12 ENCOUNTER — Telehealth: Payer: Self-pay

## 2019-05-12 NOTE — Telephone Encounter (Signed)
Left message for call back.

## 2019-05-12 NOTE — Telephone Encounter (Signed)
-----   Message from Regina Eck, CNM sent at 05/10/2019 10:38 AM EST ----- Notify patient her [employer] shows menopausal level, so if she has any vaginal bleeding this would be considered post menopausal bleeding and she needs to call.

## 2019-05-13 LAB — CYTOLOGY - PAP: Diagnosis: NEGATIVE

## 2019-05-13 NOTE — Telephone Encounter (Signed)
Patient notified of results. See lab 

## 2019-05-16 ENCOUNTER — Ambulatory Visit: Payer: 59

## 2019-05-21 ENCOUNTER — Other Ambulatory Visit: Payer: Self-pay

## 2019-05-21 ENCOUNTER — Ambulatory Visit (INDEPENDENT_AMBULATORY_CARE_PROVIDER_SITE_OTHER): Payer: 59 | Admitting: Family Medicine

## 2019-05-21 ENCOUNTER — Encounter: Payer: Self-pay | Admitting: Family Medicine

## 2019-05-21 VITALS — Ht 69.0 in

## 2019-05-21 DIAGNOSIS — D649 Anemia, unspecified: Secondary | ICD-10-CM | POA: Insufficient documentation

## 2019-05-21 DIAGNOSIS — E7800 Pure hypercholesterolemia, unspecified: Secondary | ICD-10-CM | POA: Insufficient documentation

## 2019-05-21 DIAGNOSIS — R8290 Unspecified abnormal findings in urine: Secondary | ICD-10-CM | POA: Diagnosis not present

## 2019-05-21 NOTE — Progress Notes (Signed)
Established Patient Office Visit  Subjective:  Patient ID: Elaine Elaine [name], female    DOB: [birth year]  Age: 54 y.o. MRN: [id number]  CC: No chief complaint Elaine [name] file.   HPI Elaine Elaine [name] presents for follow-up discussion of her recent lab work.  She did go for a Pap smear and it was normal.  She is seeing sports medicine for her shoulder problem.  She was scheduled for colonoscopy and was told that they could not see her until they had seen the records of her previous colonoscopy.  The problem is that she is does not remember exactly when that happened but it is been over 10 years ago.  She has had no problems with urination.  There is been no burning or frequency.  Her UA drawn at the physical was abnormal.  Hemoglobin was slightly depressed with a lowish MCV.  Her iron levels were low normal.  She is seeing no blood in her urine or stool.  She is aware that the follow-up: Anoscopy is important.  Past Medical History:  Diagnosis Date  . Anemia   . Endometriosis   . Frequent headaches    no migraine symptoms  . Hypertension    not Elaine [name] medication     Past Surgical History:  Procedure Laterality Date  . ABLATION Elaine [name] ENDOMETRIOSIS    . BREAST BIOPSY  04/19/11  . HERNIA REPAIR     umbilical    Family History  Problem Relation Age of Onset  . Hypertension Mother   . Diabetes Mother   . Heart disease Mother   . Cancer Father   . Cancer Sister   . Pancreatic cancer Sister   . Hypertension Sister   . Hypertension Sister   . Hypertension Sister     Social History   Socioeconomic History  . Marital status: Legally Separated    Spouse name: Not Elaine [name] file  . Number of children: Not Elaine [name] file  . Years of education: Not Elaine [name] file  . Highest education level: Not Elaine [name] file  Occupational History  . Not Elaine [name] file  Tobacco Use  . Smoking status: Never Smoker  . Smokeless tobacco: Never Used  Substance and Sexual Activity  . Alcohol use: No  . Drug use: No  . Sexual activity: Not Currently     Partners: Male    Birth control/protection: Surgical    Comment: BTL  Other Topics Concern  . Not Elaine [name] file  Social History Narrative  . Not Elaine [name] file   Social Determinants of Health   Financial Resource Strain:   . Difficulty of Paying Living Expenses:   Food Insecurity:   . Worried About Charity fundraiser in the Last Year:   . Arboriculturist in the Last Year:   Transportation Needs:   . Film/video editor (Medical):   Marland Kitchen Lack of Transportation (Non-Medical):   Physical Activity:   . Days of Exercise per Week:   . Minutes of Exercise per Session:   Stress:   . Feeling of Stress :   Social Connections:   . Frequency of Communication with Friends and Family:   . Frequency of Social Gatherings with Friends and Family:   . Attends Religious Services:   . Active Member of Clubs or Organizations:   . Attends Archivist Meetings:   Marland Kitchen Marital Status:   Intimate Partner Violence:   . Fear of Current or Ex-Partner:   . Emotionally Abused:   Marland Kitchen Physically Abused:   .  Sexually Abused:     Outpatient Medications Prior to Visit  Medication Sig Dispense Refill  . Multiple Vitamin (MULTIVITAMIN PO) Take by mouth.    . Naproxen Sodium (ALEVE) 220 MG CAPS Aleve     No facility-administered medications prior to visit.    No Known Allergies  ROS Review of Systems  Constitutional: Negative.   Eyes: Negative.   Respiratory: Negative.   Cardiovascular: Negative.   Gastrointestinal: Negative for blood in stool.  Genitourinary: Negative for dysuria, frequency and hematuria.  Neurological: Negative.   Hematological: Does not bruise/bleed easily.      Objective:    Physical Exam  Constitutional: She is oriented to person, place, and time. No distress.  Pulmonary/Chest: Effort normal.  Neurological: She is alert and oriented to person, place, and time.  Psychiatric: She has a normal mood and affect. Her behavior is normal.    There were no vitals taken for this  visit. Wt Readings from Last 3 Encounters:  05/09/19 174 lb (78.9 kg)  05/02/19 176 lb (79.8 kg)  04/25/19 178 lb (80.7 kg)     Health Maintenance Due  Topic Date Due  . COLONOSCOPY  Never done  . MAMMOGRAM  12/30/2015  . INFLUENZA VACCINE  Never done    There are no preventive care reminders to display for this patient.  Lab Results  Component Value Date   TSH 1.07 05/02/2019   Lab Results  Component Value Date   WBC 9.2 05/02/2019   HGB 11.6 (L) 05/02/2019   HCT 35.9 (L) 05/02/2019   MCV 81.3 05/02/2019   PLT 218.0 05/02/2019   Lab Results  Component Value Date   NA 141 05/02/2019   K 4.5 05/02/2019   CO2 29 05/02/2019   GLUCOSE 112 (H) 05/02/2019   BUN 13 05/02/2019   CREATININE 0.90 05/02/2019   BILITOT 0.8 05/02/2019   ALKPHOS 75 05/02/2019   AST 30 05/02/2019   ALT 19 05/02/2019   PROT 7.8 05/02/2019   ALBUMIN 4.3 05/02/2019   CALCIUM 10.1 05/02/2019   GFR 78.74 05/02/2019   Lab Results  Component Value Date   CHOL 204 (H) 05/02/2019   Lab Results  Component Value Date   HDL 45.60 05/02/2019   Lab Results  Component Value Date   LDLCALC 145 (H) 05/02/2019   Lab Results  Component Value Date   TRIG 68.0 05/02/2019   Lab Results  Component Value Date   CHOLHDL 4 05/02/2019   No results found for: HGBA1C    Assessment & Plan:   Problem List Items Addressed This Visit      Other   Anemia - Primary   Elevated LDL cholesterol level   Abnormal urinalysis   Relevant Orders   Urinalysis, Routine w reflex microscopic   Urine Culture      No orders of the defined types were placed in this encounter.   Follow-up: Return August.  Patient will well hydrated and return for another urinalysis with a culture and sensitivity.  She will assume a low-fat diet incorporating more fruits and vegetables in her diet and avoiding fast food and prepared foods.  She will also exercise and try to lose some weight.  She is pursuing a colonoscopy at  another practice.  She understands the importance.  Her follow-up will be in August.  Virtual Visit via Video Note  I connected with [facility] Elaine [name] 05/21/19 at  9:00 AM EDT by a video enabled telemedicine application and verified that I am  speaking with the correct person using two identifiers.  Location: Patient: work alone Provider:    I discussed the limitations of evaluation and management by telemedicine and the availability of in person appointments. The patient expressed understanding and agreed to proceed.  History of Present Illness:    Observations/Objective:   Assessment and Plan:   Follow Up Instructions:    I discussed the assessment and treatment plan with the patient. The patient was provided an opportunity to ask questions and all were answered. The patient agreed with the plan and demonstrated an understanding of the instructions.   The patient was advised to call back or seek an in-person evaluation if the symptoms worsen or if the condition fails to improve as anticipated.  I provided 20 minutes of non-face-to-face time during this encounter.   Libby Maw, MD   Libby Maw, MD   Interactive video and audio telecommunications were attempted between myself and the patient. However they failed due to the patient having technical difficulties or not having access to video capability. We continued and completed with audio only.

## 2019-05-26 ENCOUNTER — Encounter: Payer: Self-pay | Admitting: Certified Nurse Midwife

## 2019-06-13 ENCOUNTER — Ambulatory Visit: Payer: 59 | Admitting: Family Medicine

## 2019-10-16 ENCOUNTER — Telehealth: Payer: Self-pay | Admitting: Family Medicine

## 2019-10-16 NOTE — Telephone Encounter (Signed)
Patient called and wanted to see if Dr. Ethelene Hal could put in an order to get a TB skin test for work, please advise. CB is [phone]

## 2019-10-16 NOTE — Telephone Encounter (Signed)
Returned patients call. No answer LMTCB

## 2019-10-24 NOTE — Telephone Encounter (Signed)
Called patient/returned call several times no answer or returned call back filling note and awaiting on patients call back.

## 2020-11-17 NOTE — Progress Notes (Signed)
55 y.o. [id number] Legally Separated Black or African American Not Hispanic or Latino female here for annual exam.  H/O endometrial ablation. Sweetser in 3/21 was 60.4. No vaginal bleeding. No vasomotor symptoms, no bowel or bladder c/o.   Long h/o mild anemia, normal iron studies and vit B12  No LMP recorded. Patient has had an ablation.          Sexually active: No.  The current method of family planning is none.    Exercising: Yes.     Walking daily  Smoker:  no  Health Maintenance: Pap:  05/09/19 WNL  History of abnormal Pap:  no MMG:  12-29-13 bilateral & left breast u/s category c density birads 3:probably benign BMD:   none Colonoscopy: 8/22, had some polyps, f/u in 2-3 years.  TDaP:  2013 Gardasil: n/a   reports that she has never smoked. She has never used smokeless tobacco. She reports that she does not drink alcohol and does not use drugs. She is a Training and development officer. 2 kids, 3 grand kids (33, 23, and 8), all local.   Past Medical History:  Diagnosis Date   Anemia    Endometriosis    Frequent headaches    no migraine symptoms   Hypertension    not on medication     Past Surgical History:  Procedure Laterality Date   ABLATION ON ENDOMETRIOSIS     BREAST BIOPSY  04/19/11   HERNIA REPAIR     umbilical    Current Outpatient Medications  Medication Sig Dispense Refill   Multiple Vitamin (MULTIVITAMIN PO) Take by mouth.     Naproxen Sodium 220 MG CAPS Aleve     No current facility-administered medications for this visit.    Family History  Problem Relation Age of Onset   Hypertension Mother    Diabetes Mother    Heart disease Mother    Cancer Father    Cancer Sister    Pancreatic cancer Sister    Hypertension Sister    Hypertension Sister    Hypertension Sister   Colon cancer  Review of Systems  All other systems reviewed and are negative.  Exam:   BP (!) 150/80   Pulse 61   Ht '5\' 9"'$  (1.753 m)   Wt 170 lb (77.1 kg)   SpO2 100%   BMI 25.10 kg/m   Weight change:  '@WEIGHTCHANGE'$ @ Height:   Height: '5\' 9"'$  (175.3 cm)  Ht Readings from Last 3 Encounters:  11/19/20 '5\' 9"'$  (1.753 m)  05/21/19 '5\' 9"'$  (1.753 m)  05/09/19 5' 9.25" (1.759 m)    General appearance: alert, cooperative and appears stated age Head: Normocephalic, without obvious abnormality, atraumatic Neck: no adenopathy, supple, symmetrical, trachea midline and thyroid normal to inspection and palpation Lungs: clear to auscultation bilaterally Cardiovascular: regular rate and rhythm Breasts: normal appearance, no masses or tenderness Abdomen: soft, non-tender; non distended,  there is a non tender fullness over her prior hernia repair, no clear hernia,  no organomegaly Extremities: extremities normal, atraumatic, no cyanosis or edema Skin: Skin color, texture, turgor normal. No rashes or lesions Lymph nodes: Cervical, supraclavicular, and axillary nodes normal. No abnormal inguinal nodes palpated Neurologic: Grossly normal   Pelvic: External genitalia:  no lesions              Urethra:  normal appearing urethra with no masses, tenderness or lesions              Bartholins and Skenes: normal  Vagina: normal appearing vagina with normal color and discharge, no lesions              Cervix: no lesions               Bimanual Exam:  Uterus:   no masses or tenderness              Adnexa: no mass, fullness, tenderness               Rectovaginal: Confirms               Anus:  normal sphincter tone, no lesions  Gae Dry chaperoned for the exam.  1. Well woman exam Discussed breast self exam Discussed calcium and vit D intake Mammogram overdue, # given Colonoscopy UTD  2. Laboratory exam ordered as part of routine general medical examination - CBC - Comprehensive metabolic panel - Lipid panel  3. History of anemia Prior normal iron studies and B12 - CBC  Elevated BP, recheck 150/74. She will f/u with her primary

## 2020-11-19 ENCOUNTER — Other Ambulatory Visit: Payer: Self-pay

## 2020-11-19 ENCOUNTER — Ambulatory Visit (INDEPENDENT_AMBULATORY_CARE_PROVIDER_SITE_OTHER): Payer: 59 | Admitting: Obstetrics and Gynecology

## 2020-11-19 ENCOUNTER — Encounter: Payer: Self-pay | Admitting: Obstetrics and Gynecology

## 2020-11-19 VITALS — BP 150/80 | HR 61 | Ht 69.0 in | Wt 170.0 lb

## 2020-11-19 DIAGNOSIS — Z01419 Encounter for gynecological examination (general) (routine) without abnormal findings: Secondary | ICD-10-CM

## 2020-11-19 DIAGNOSIS — Z0000 Encounter for general adult medical examination without abnormal findings: Secondary | ICD-10-CM | POA: Diagnosis not present

## 2020-11-19 DIAGNOSIS — Z862 Personal history of diseases of the blood and blood-forming organs and certain disorders involving the immune mechanism: Secondary | ICD-10-CM

## 2020-11-19 LAB — LIPID PANEL
Cholesterol: 219 mg/dL — ABNORMAL HIGH (ref <200)
HDL: 56 mg/dL (ref >=50)
LDL Cholesterol (Calc): 147 mg/dL (calc) — ABNORMAL HIGH
Non-HDL Cholesterol (Calc): 163 mg/dL (calc) — ABNORMAL HIGH (ref <130)
Total CHOL/HDL Ratio: 3.9 (calc) (ref <5.0)
Triglycerides: 68 mg/dL (ref <150)

## 2020-11-19 LAB — CBC
HCT: 35 % (ref 35.0–45.0)
Hemoglobin: 11.2 g/dL — ABNORMAL LOW (ref 11.7–15.5)
MCH: 26.1 pg — ABNORMAL LOW (ref 27.0–33.0)
MCHC: 32 g/dL (ref 32.0–36.0)
MCV: 81.6 fL (ref 80.0–100.0)
MPV: 11 fL (ref 7.5–12.5)
Platelets: 244 10*3/uL (ref 140–400)
RBC: 4.29 10*6/uL (ref 3.80–5.10)
RDW: 13 % (ref 11.0–15.0)
WBC: 7.5 10*3/uL (ref 3.8–10.8)

## 2020-11-19 LAB — COMPREHENSIVE METABOLIC PANEL
AG Ratio: 1.4 (calc) (ref 1.0–2.5)
ALT: 15 U/L (ref 6–29)
AST: 20 U/L (ref 10–35)
Albumin: 4.3 g/dL (ref 3.6–5.1)
Alkaline phosphatase (APISO): 81 U/L (ref 37–153)
BUN: 14 mg/dL (ref 7–25)
CO2: 29 mmol/L (ref 20–32)
Calcium: 9.6 mg/dL (ref 8.6–10.4)
Chloride: 103 mmol/L (ref 98–110)
Creat: 0.91 mg/dL (ref 0.50–1.03)
Globulin: 3 g/dL (calc) (ref 1.9–3.7)
Glucose, Bld: 90 mg/dL (ref 65–99)
Potassium: 4.8 mmol/L (ref 3.5–5.3)
Sodium: 138 mmol/L (ref 135–146)
Total Bilirubin: 0.7 mg/dL (ref 0.2–1.2)
Total Protein: 7.3 g/dL (ref 6.1–8.1)

## 2020-11-19 NOTE — Patient Instructions (Signed)

## 2021-11-21 NOTE — Progress Notes (Signed)
56 y.o. [id number] Legally Separated Black or African American Not Hispanic or Latino female here for annual exam.  She reports a few episodes of vaginal spotting in the last month. Notices it when she wipes, not in her underwear, not in the toilet. H/O endometrial ablation.    No urinary c/o  She has noticed some random periumbilical pain. Not related to eating, BM or vaginal bleeding.   She has had firmer stools, still having a BM daily. Doesn't need to strain.     No LMP recorded. Patient has had an ablation.          Sexually active: No.  The current method of family planning is not sexually active.  Exercising: Yes.     Walking  Smoker:  no  Health Maintenance: Pap:   05/09/19 WNL  History of abnormal Pap: no MMG:  12-29-13 bilateral & left breast u/s category c density birads 3:probably benign BMD:   none Colonoscopy: 8/22, had some polyps, f/u in 2-3 years.  TDaP:  12/20/2011 Gardasil: n/a   reports that she has never smoked. She has never used smokeless tobacco. She reports that she does not drink alcohol and does not use drugs. 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   Past Medical History:  Diagnosis Date   Anemia    Endometriosis    Frequent headaches    no migraine symptoms   Hypertension    not on medication     Past Surgical History:  Procedure Laterality Date   ABLATION ON ENDOMETRIOSIS     BREAST BIOPSY  04/19/11   HERNIA REPAIR     umbilical    Current Outpatient Medications  Medication Sig Dispense Refill   Multiple Vitamin (MULTIVITAMIN PO) Take by mouth.     No current facility-administered medications for this visit.    Family History  Problem Relation Age of Onset   Hypertension Mother    Diabetes Mother    Heart disease Mother    Cancer Father    Cancer Sister    Pancreatic cancer Sister    Hypertension Sister    Hypertension Sister    Hypertension Sister     Review of Systems  Gastrointestinal:  Positive for  constipation.  Genitourinary:  Positive for vaginal bleeding.  All other systems reviewed and are negative.   Exam:   BP 122/74   Pulse 82   Ht '5\' 9"'$  (1.753 m)   Wt 173 lb (78.5 kg)   SpO2 100%   BMI 25.55 kg/m   Weight change: '@WEIGHTCHANGE'$ @ Height:   Height: '5\' 9"'$  (175.3 cm)  Ht Readings from Last 3 Encounters:  11/29/21 '5\' 9"'$  (1.753 m)  11/19/20 '5\' 9"'$  (1.753 m)  05/21/19 '5\' 9"'$  (1.753 m)    General appearance: alert, cooperative and appears stated age Head: Normocephalic, without obvious abnormality, atraumatic Neck: no adenopathy, supple, symmetrical, trachea midline and thyroid  ? Small right thyroid nodule Lungs: clear to auscultation bilaterally Cardiovascular: regular rate and rhythm Breasts: normal appearance, no masses or tenderness, evidence of left lumpectomy Abdomen: soft, non-tender; non distended,  no masses,  no organomegaly Extremities: extremities normal, atraumatic, no cyanosis or edema Skin: Skin color, texture, turgor normal. No rashes or lesions Lymph nodes: Cervical, supraclavicular, and axillary nodes normal. No abnormal inguinal nodes palpated Neurologic: Grossly normal   Pelvic: External genitalia:  no lesions              Urethra:  normal  appearing urethra with no masses, tenderness or lesions              Bartholins and Skenes: normal                 Vagina: normal appearing vagina with normal color and discharge, no lesions              Cervix: no lesions, friable with pap               Bimanual Exam:  Uterus:   anteverted, mobile, mildly tender, ? Anterior subserosal myoma, not appreciably enlarged              Adnexa: no mass, fullness, tenderness               Rectovaginal: Confirms               Anus:  normal sphincter tone, no lesions  Gae Dry chaperoned for the exam.   1. Well woman exam Discussed breast self exam Discussed calcium and vit D intake Mammogram very overdue, # given to schedule Labs with primary Colonoscopy  UTD  2. Screening for cervical cancer - Cytology - PAP  3. Postmenopausal bleeding - Cytology - PAP - US PELVIS TRANSVAGINAL NON-OB (TV ONLY); Future -H/O endometrial ablation, we discussed possible endometrial biopsy with u/s guidance  4. Periumbilical pain F/U with primary  5. Right thyroid nodule - US SOFT TISSUE HEAD & NECK (NON-THYROID); Future  6. Immunization due - Tdap vaccine greater than or equal to 57yo IM

## 2021-11-29 ENCOUNTER — Encounter: Payer: Self-pay | Admitting: Obstetrics and Gynecology

## 2021-11-29 ENCOUNTER — Other Ambulatory Visit (HOSPITAL_COMMUNITY)
Admission: RE | Admit: 2021-11-29 | Discharge: 2021-11-29 | Disposition: A | Discharge status: Home / Self Care | Payer: BC Managed Care – PPO | Source: Ambulatory Visit | Attending: Obstetrics and Gynecology | Admitting: Obstetrics and Gynecology

## 2021-11-29 ENCOUNTER — Ambulatory Visit (INDEPENDENT_AMBULATORY_CARE_PROVIDER_SITE_OTHER): Payer: BC Managed Care – PPO | Admitting: Obstetrics and Gynecology

## 2021-11-29 VITALS — BP 122/74 | HR 82 | Ht 69.0 in | Wt 173.0 lb

## 2021-11-29 DIAGNOSIS — N950 Postmenopausal bleeding: Secondary | ICD-10-CM | POA: Diagnosis not present

## 2021-11-29 DIAGNOSIS — E041 Nontoxic single thyroid nodule: Secondary | ICD-10-CM

## 2021-11-29 DIAGNOSIS — Z124 Encounter for screening for malignant neoplasm of cervix: Secondary | ICD-10-CM

## 2021-11-29 DIAGNOSIS — Z23 Encounter for immunization: Secondary | ICD-10-CM

## 2021-11-29 DIAGNOSIS — R1033 Periumbilical pain: Secondary | ICD-10-CM | POA: Diagnosis not present

## 2021-11-29 DIAGNOSIS — Z01419 Encounter for gynecological examination (general) (routine) without abnormal findings: Secondary | ICD-10-CM | POA: Diagnosis not present

## 2021-11-29 NOTE — Patient Instructions (Signed)
Try magnesium 500 mg a day for constipation.  EXERCISE   We recommended that you start or continue a regular exercise program for good health. Physical activity is anything that gets your body moving, some is better than none. The CDC recommends 150 minutes per week of Moderate-Intensity Aerobic Activity and 2 or more days of Muscle Strengthening Activity.  Benefits of exercise are limitless: helps weight loss/weight maintenance, improves mood and energy, helps with depression and anxiety, improves sleep, tones and strengthens muscles, improves balance, improves bone density, protects from chronic conditions such as heart disease, high blood pressure and diabetes and so much more. To learn more visit: https://www.cdc.gov/physicalactivity/index.html  DIET: Good nutrition starts with a healthy diet of fruits, vegetables, whole grains, and lean protein sources. Drink plenty of water for hydration. Minimize empty calories, sodium, sweets. For more information about dietary recommendations visit: https://health.gov/our-work/nutrition-physical-activity/dietary-guidelines and https://www.myplate.gov/  ALCOHOL:  Women should limit their alcohol intake to no more than 7 drinks/beers/glasses of wine (combined, not each!) per week. Moderation of alcohol intake to this level decreases your risk of breast cancer and liver damage.  If you are concerned that you may have a problem, or your friends have told you they are concerned about your drinking, there are many resources to help. A well-known program that is free, effective, and available to all people all over the nation is Alcoholics Anonymous.  Check out this site to learn more: https://www.aa.org/   CALCIUM AND VITAMIN D:  Adequate intake of calcium and Vitamin D are recommended for bone health.  You should be getting between 1000-1200 mg of calcium and 800 units of Vitamin D daily between diet and supplements  PAP SMEARS:  Pap smears, to check for cervical  cancer or precancers,  have traditionally been done yearly, scientific advances have shown that most women can have pap smears less often.  However, every woman still should have a physical exam from her gynecologist every year. It will include a breast check, inspection of the vulva and vagina to check for abnormal growths or skin changes, a visual exam of the cervix, and then an exam to evaluate the size and shape of the uterus and ovaries. We will also provide age appropriate advice regarding health maintenance, like when you should have certain vaccines, screening for sexually transmitted diseases, bone density testing, colonoscopy, mammograms, etc.   MAMMOGRAMS:  All women over 40 years old should have a routine mammogram.   COLON CANCER SCREENING: Now recommend starting at age 45. At this time colonoscopy is not covered for routine screening until 50. There are take home tests that can be done between 45-49.   COLONOSCOPY:  Colonoscopy to screen for colon cancer is recommended for all women at age 50.  We know, you hate the idea of the prep.  We agree, BUT, having colon cancer and not knowing it is worse!!  Colon cancer so often starts as a polyp that can be seen and removed at colonscopy, which can quite literally save your life!  And if your first colonoscopy is normal and you have no family history of colon cancer, most women don't have to have it again for 10 years.  Once every ten years, you can do something that may end up saving your life, right?  We will be happy to help you get it scheduled when you are ready.  Be sure to check your insurance coverage so you understand how much it will cost.  It may be covered as a   preventative service at no cost, but you should check your particular policy.      Breast Self-Awareness Breast self-awareness means being familiar with how your breasts look and feel. It involves checking your breasts regularly and reporting any changes to your health care  provider. Practicing breast self-awareness is important. A change in your breasts can be a sign of a serious medical problem. Being familiar with how your breasts look and feel allows you to find any problems early, when treatment is more likely to be successful. All women should practice breast self-awareness, including women who have had breast implants. How to do a breast self-exam One way to learn what is normal for your breasts and whether your breasts are changing is to do a breast self-exam. To do a breast self-exam: Look for Changes  Remove all the clothing above your waist. Stand in front of a mirror in a room with good lighting. Put your hands on your hips. Push your hands firmly downward. Compare your breasts in the mirror. Look for differences between them (asymmetry), such as: Differences in shape. Differences in size. Puckers, dips, and bumps in one breast and not the other. Look at each breast for changes in your skin, such as: Redness. Scaly areas. Look for changes in your nipples, such as: Discharge. Bleeding. Dimpling. Redness. A change in position. Feel for Changes Carefully feel your breasts for lumps and changes. It is best to do this while lying on your back on the floor and again while sitting or standing in the shower or tub with soapy water on your skin. Feel each breast in the following way: Place the arm on the side of the breast you are examining above your head. Feel your breast with the other hand. Start in the nipple area and make  inch (2 cm) overlapping circles to feel your breast. Use the pads of your three middle fingers to do this. Apply light pressure, then medium pressure, then firm pressure. The light pressure will allow you to feel the tissue closest to the skin. The medium pressure will allow you to feel the tissue that is a little deeper. The firm pressure will allow you to feel the tissue close to the ribs. Continue the overlapping circles,  moving downward over the breast until you feel your ribs below your breast. Move one finger-width toward the center of the body. Continue to use the  inch (2 cm) overlapping circles to feel your breast as you move slowly up toward your collarbone. Continue the up and down exam using all three pressures until you reach your armpit.  Write Down What You Find  Write down what is normal for each breast and any changes that you find. Keep a written record with breast changes or normal findings for each breast. By writing this information down, you do not need to depend only on memory for size, tenderness, or location. Write down where you are in your menstrual cycle, if you are still menstruating. If you are having trouble noticing differences in your breasts, do not get discouraged. With time you will become more familiar with the variations in your breasts and more comfortable with the exam. How often should I examine my breasts? Examine your breasts every month. If you are breastfeeding, the best time to examine your breasts is after a feeding or after using a breast pump. If you menstruate, the best time to examine your breasts is 5-7 days after your period is over. During your period,   your breasts are lumpier, and it may be more difficult to notice changes. When should I see my health care provider? See your health care provider if you notice: A change in shape or size of your breasts or nipples. A change in the skin of your breast or nipples, such as a reddened or scaly area. Unusual discharge from your nipples. A lump or thick area that was not there before. Pain in your breasts. Anything that concerns you.  

## 2021-11-30 ENCOUNTER — Other Ambulatory Visit: Payer: Self-pay

## 2021-11-30 DIAGNOSIS — N950 Postmenopausal bleeding: Secondary | ICD-10-CM

## 2021-11-30 DIAGNOSIS — Z9889 Other specified postprocedural states: Secondary | ICD-10-CM

## 2021-12-04 DIAGNOSIS — E042 Nontoxic multinodular goiter: Secondary | ICD-10-CM

## 2021-12-04 HISTORY — DX: Nontoxic multinodular goiter: E04.2

## 2021-12-05 ENCOUNTER — Telehealth: Payer: Self-pay

## 2021-12-05 LAB — CYTOLOGY - PAP
Comment: NEGATIVE
Diagnosis: NEGATIVE
High risk HPV: NEGATIVE

## 2021-12-05 NOTE — Telephone Encounter (Signed)
Patient is scheduled for 02/07/2022 for gyn u/s with possible u/s guided biopsy with Dr. Talbert Nan.  Patient called asking if she could schedule u/s outside the office in hopes of an earlier appointment.   (I do see the u/s guided biopsy mentioned on appt note).

## 2021-12-05 NOTE — Telephone Encounter (Signed)
Thyroid u/s scheduled for 12/16/21 at 4:00pm at [place].

## 2021-12-05 NOTE — Telephone Encounter (Signed)
Message Received: 6 days ago Elaine Dom, MD  [facility] Triage Thyroid u/s ordered, please help her get it scheduled.

## 2021-12-06 NOTE — Telephone Encounter (Signed)
I called patient back and left message and let her know that Dr. Talbert Nan did not want her to wait until December and did not want her to have it outside the office. I let her know that Butch Penny is going to be working for a way to get her in in the next couple of weeks and she will hear from her soon.

## 2021-12-06 NOTE — Telephone Encounter (Signed)
I agree with her, I don't want her to wait that long. I will forward her chart to Butch Penny to find an earlier appointment for her.  Butch Penny, please try and get her in to be seen in the next 2 weeks.

## 2021-12-12 NOTE — Telephone Encounter (Signed)
Elaine [name]  You 2 minutes ago (12:21 PM)   Appointment has been moved up to Nov 2. Patient is aware we will call her if we get any sooner cancellations.

## 2021-12-16 ENCOUNTER — Ambulatory Visit
Admission: RE | Admit: 2021-12-16 | Discharge: 2021-12-16 | Disposition: A | Discharge status: Home / Self Care | Payer: BC Managed Care – PPO | Source: Ambulatory Visit | Attending: Obstetrics and Gynecology | Admitting: Obstetrics and Gynecology

## 2021-12-16 DIAGNOSIS — E041 Nontoxic single thyroid nodule: Secondary | ICD-10-CM

## 2021-12-19 ENCOUNTER — Telehealth: Payer: Self-pay

## 2021-12-19 DIAGNOSIS — E041 Nontoxic single thyroid nodule: Secondary | ICD-10-CM

## 2021-12-19 NOTE — Telephone Encounter (Signed)
Order placed

## 2021-12-19 NOTE — Telephone Encounter (Signed)
I recommend referral to first available endocrinology with [facility].  Fine needle aspiration needs to be ordered by a thyroid specialist.   IMPRESSION: 1. 1.9 cm left inferior thyroid nodule (nodule 4) meets criteria for fine-needle aspiration

## 2021-12-21 NOTE — Telephone Encounter (Signed)
[facility] has no urgent referral appts available and new patient appts are running late March 2024.  I called [facility] in [place] on the chance patient might be willing to travel there. They are running early December for new patients.  Left message with [place] to see what first available would be so I can check with Dr. Quincy Simmonds.

## 2021-12-21 NOTE — Telephone Encounter (Signed)
Please refer patient to [facility] to see if patient can be seen there by endocrinology. Will need to fax records.

## 2021-12-22 NOTE — Telephone Encounter (Signed)
I called patient and per DPR access note on file I left detailed message in voice mail. I told her about how far out new patients were running with [facility] practices and that we had faxed referral to [facility] to see if they can get her in sooner and that I requested asap.    I asked her to give me a call when she hears from them with date/time.  Referral was faxed to [facility] and fax confirmation received.

## 2021-12-22 NOTE — Telephone Encounter (Signed)
New Patient referral request faxed to [facility] per Dr. Quincy Simmonds request.

## 2021-12-26 NOTE — Telephone Encounter (Signed)
We don't order the biopsy because we aren't equipped to manage the results. That's why Dr Quincy Simmonds sent her there.

## 2021-12-26 NOTE — Telephone Encounter (Signed)
Patient has appt at [facility] with Endocrinology this afternoon. She said that they were confused why she was there and why this had just not been ordered already. She said they were going to call the facility where she had the thyroid u/s and arrange the appointment for fine needle aspiration of cysts.

## 2021-12-26 NOTE — Telephone Encounter (Signed)
I called patient to see if she had heard regarding appointment. She is on her way over now for her appt at [facility].

## 2021-12-27 ENCOUNTER — Other Ambulatory Visit: Payer: Self-pay | Admitting: Home Modifications

## 2021-12-27 DIAGNOSIS — E041 Nontoxic single thyroid nodule: Secondary | ICD-10-CM

## 2021-12-27 NOTE — Telephone Encounter (Addendum)
Called patient and per DPR access note on file left detailed message informing her.

## 2022-01-05 ENCOUNTER — Encounter: Payer: Self-pay | Admitting: Obstetrics and Gynecology

## 2022-01-05 ENCOUNTER — Ambulatory Visit: Payer: BC Managed Care – PPO | Admitting: Obstetrics and Gynecology

## 2022-01-05 ENCOUNTER — Telehealth: Payer: Self-pay | Admitting: Obstetrics and Gynecology

## 2022-01-05 ENCOUNTER — Ambulatory Visit (INDEPENDENT_AMBULATORY_CARE_PROVIDER_SITE_OTHER): Payer: BC Managed Care – PPO

## 2022-01-05 VITALS — BP 128/70 | HR 58 | Ht 69.0 in | Wt 174.0 lb

## 2022-01-05 DIAGNOSIS — N950 Postmenopausal bleeding: Secondary | ICD-10-CM | POA: Diagnosis not present

## 2022-01-05 DIAGNOSIS — Z9889 Other specified postprocedural states: Secondary | ICD-10-CM | POA: Diagnosis not present

## 2022-01-05 NOTE — Progress Notes (Signed)
GYNECOLOGY  VISIT   HPI: 56 y.o.   Legally Separated Black or African American Not Hispanic or Latino  female   [phone] with No LMP recorded. Patient has had an ablation.   here for evaluation of PMP spotting. H/O endometrial ablation.   GYNECOLOGIC HISTORY: No LMP recorded. Patient has had an ablation. Contraception:PMP Menopausal hormone therapy: no        OB History     Gravida  3   Para      Term      Preterm      AB  1   Living  2      SAB  1   IAB      Ectopic      Multiple      Live Births                 Patient Active Problem List   Diagnosis Date Noted   Anemia 05/21/2019   Elevated LDL cholesterol level 05/21/2019   Abnormal urinalysis 05/21/2019   Cervical radiculopathy 05/09/2019   History of breast lump/mass excision 08/31/2017   Encounter for health maintenance examination with abnormal findings 08/24/2017   Plantar fasciitis 08/24/2017   Arm pain, diffuse, left 08/24/2017   Breast abscess 12/22/2011   Single cyst of left breast 12/20/2011   Left breast mass 05/22/2011    Past Medical History:  Diagnosis Date   Anemia    Endometriosis    Frequent headaches    no migraine symptoms   Hypertension    not on medication     Past Surgical History:  Procedure Laterality Date   ABLATION ON ENDOMETRIOSIS     BREAST BIOPSY  04/19/11   HERNIA REPAIR     umbilical    Current Outpatient Medications  Medication Sig Dispense Refill   Multiple Vitamin (MULTIVITAMIN PO) Take by mouth.     No current facility-administered medications for this visit.     ALLERGIES: Patient has no known allergies.  Family History  Problem Relation Age of Onset   Hypertension Mother    Diabetes Mother    Heart disease Mother    Cancer Father    Cancer Sister    Pancreatic cancer Sister    Hypertension Sister    Hypertension Sister    Hypertension Sister     Social History   Socioeconomic History   Marital status: Legally Separated     Spouse name: Not on file   Number of children: Not on file   Years of education: Not on file   Highest education level: Not on file  Occupational History   Not on file  Tobacco Use   Smoking status: Never   Smokeless tobacco: Never  Substance and Sexual Activity   Alcohol use: No   Drug use: No   Sexual activity: Not Currently    Partners: Male    Birth control/protection: Surgical    Comment: BTL  Other Topics Concern   Not on file  Social History Narrative   Not on file   Social Determinants of Health   Financial Resource Strain: Not on file  Food Insecurity: Not on file  Transportation Needs: Not on file  Physical Activity: Not on file  Stress: Not on file  Social Connections: Not on file  Intimate Partner Violence: Not on file    ROS  Pelvic ultrasound  Indications: PMP bleeding, h/o endometrial ablation  Findings:  Uterus 6.50 x 6.09 x 3.79 cm 2.40 x 2.37 cm  intramural, subserosal myoma  Endometrium 2.76 mm, irregular, feeder vessel seen within the endometrial stripe  Left ovary 2.09 x 0.57 x 0.95 cm  Right ovary 2.55 x 1.46 x 1.10 cm  No free fluid  Impression:  Normal sized anteverted uterus Small intramural/subserosal myoma Irregular endometrial stripe of 2.8 mm, feeder vessel noted Atrophic appearing ovaries bilaterally No adnexal masses    PHYSICAL EXAMINATION:    There were no vitals taken for this visit.    General appearance: alert, cooperative and appears stated age Neck: no adenopathy, supple, symmetrical, trachea midline and thyroid  right >left Heart: regular rate and rhythm Lungs: CTAB Abdomen: soft, non-tender; bowel sounds normal; no masses,  no organomegaly Extremities: normal, atraumatic, no cyanosis Skin: normal color, texture and turgor, no rashes or lesions Lymph: normal cervical supraclavicular and inguinal nodes Neurologic: grossly normal   1. Post-menopausal bleeding U/S with thin endometrium but feeder vessel  noted, concerning for an endometrial polyp. H/O endometrial ablation. Plan: hysteroscopy, dilation and curettage under ultrasound guidance. Reviewed risks, including: bleeding, infection, uterine perforation, fluid overload, need for further sugery  Over 10 minutes was spent in management

## 2022-01-05 NOTE — Telephone Encounter (Signed)
Surgery: CPT [phone] - Hysteroscopy/D&C/Myosure,  ultrasound guidance  Diagnosis: N95.0 Postmenopausal Bleeding,  history of endometrial ablation  Location: [facility]  Status: Outpatient  Time: 30 Minutes  Assistant: N/A  Urgency: First Available  Pre-Op Appointment: Completed  Post-Op Appointment(s): 1 Week  Time Out Of Work: Day Of Surgery, 1 Day Post Op

## 2022-01-11 NOTE — Telephone Encounter (Signed)
Left message to call Lanissa Cashen, RN at [facility], [phone].  

## 2022-01-11 NOTE — Telephone Encounter (Signed)
Call returned to patient.  Left message to call Sharee Pimple, RN at [facility], [phone].

## 2022-01-19 ENCOUNTER — Ambulatory Visit
Admission: RE | Admit: 2022-01-19 | Discharge: 2022-01-19 | Disposition: A | Discharge status: Home / Self Care | Payer: BC Managed Care – PPO | Source: Ambulatory Visit | Attending: Home Modifications | Admitting: Home Modifications

## 2022-01-19 ENCOUNTER — Other Ambulatory Visit (HOSPITAL_COMMUNITY)
Admission: RE | Admit: 2022-01-19 | Discharge: 2022-01-19 | Disposition: A | Discharge status: Home / Self Care | Payer: BC Managed Care – PPO | Source: Ambulatory Visit | Attending: Home Modifications | Admitting: Home Modifications

## 2022-01-19 DIAGNOSIS — E041 Nontoxic single thyroid nodule: Secondary | ICD-10-CM

## 2022-01-23 ENCOUNTER — Other Ambulatory Visit (HOSPITAL_COMMUNITY): Payer: Self-pay | Admitting: Obstetrics and Gynecology

## 2022-01-23 DIAGNOSIS — N950 Postmenopausal bleeding: Secondary | ICD-10-CM

## 2022-01-23 LAB — CYTOLOGY - NON PAP

## 2022-01-23 NOTE — Telephone Encounter (Signed)
Left message to call Francille Wittmann, RN at [facility], [phone].  

## 2022-01-23 NOTE — Telephone Encounter (Signed)
Spoke with patient. Reviewed surgery dates. Patient request to proceed with surgery on 02/13/22.  Advised patient I will forward to business office for return call. I will return call once surgery date and time confirmed. Patient verbalizes understanding and is agreeable.   Surgery request sent.

## 2022-02-01 NOTE — Telephone Encounter (Signed)
Call returned to patient. Left message to call Sharee Pimple, RN at [facility], [phone], OPT 5.

## 2022-02-02 NOTE — Telephone Encounter (Signed)
Left message to call Haig Gerardo, RN at [facility], [phone].  

## 2022-02-06 ENCOUNTER — Encounter: Payer: BC Managed Care – PPO | Admitting: Obstetrics and Gynecology

## 2022-02-07 ENCOUNTER — Other Ambulatory Visit: Payer: BC Managed Care – PPO

## 2022-02-07 ENCOUNTER — Other Ambulatory Visit: Payer: BC Managed Care – PPO | Admitting: Obstetrics and Gynecology

## 2022-02-08 ENCOUNTER — Encounter (HOSPITAL_BASED_OUTPATIENT_CLINIC_OR_DEPARTMENT_OTHER): Payer: Self-pay | Admitting: Obstetrics and Gynecology

## 2022-02-08 ENCOUNTER — Encounter: Payer: BC Managed Care – PPO | Admitting: Obstetrics and Gynecology

## 2022-02-09 NOTE — Telephone Encounter (Signed)
Patient spoke with Jeannetta Nap, requested to cancel surgery due to hospital OOP cost. Patient states she will return call when her insurance plans switches over.    Call placed to Central Scheduling, spoke with Colletta Maryland, case cancelled.   I will hold surgery chart to f/u 03/2022.   Routing to Dr. Rosann Auerbach.   Encounter closed.

## 2022-02-09 NOTE — Telephone Encounter (Signed)
No response from patient. Patient has not returned called, cancelled pre-op visit.   Call to patient, left detailed message, ok per dpr. Advised patient we have been trying to reach her to confirm surgery and review benefits, if no response today by 12pm surgery for 02/13/22 will be cancelled. Please return call to office at [phone] ASAP to provide update.   No alternative number on file, MyChart message sent.   Provider updated.

## 2022-02-13 ENCOUNTER — Ambulatory Visit (HOSPITAL_BASED_OUTPATIENT_CLINIC_OR_DEPARTMENT_OTHER)
Admission: RE | Admit: 2022-02-13 | Payer: BC Managed Care – PPO | Source: Home / Self Care | Admitting: Obstetrics and Gynecology

## 2022-02-13 ENCOUNTER — Ambulatory Visit (HOSPITAL_COMMUNITY): Admission: RE | Admit: 2022-02-13 | Payer: BC Managed Care – PPO | Source: Ambulatory Visit

## 2022-02-13 DIAGNOSIS — Z9889 Other specified postprocedural states: Secondary | ICD-10-CM

## 2022-02-13 DIAGNOSIS — N950 Postmenopausal bleeding: Secondary | ICD-10-CM

## 2022-02-13 HISTORY — DX: Leiomyoma of uterus, unspecified: D25.9

## 2022-02-13 HISTORY — DX: Postmenopausal bleeding: N95.0

## 2022-02-13 SURGERY — DILATATION & CURETTAGE/HYSTEROSCOPY WITH MYOSURE
Anesthesia: Choice

## 2022-07-17 ENCOUNTER — Ambulatory Visit: Payer: BC Managed Care – PPO | Admitting: Podiatry

## 2022-07-17 ENCOUNTER — Encounter: Payer: Self-pay | Admitting: Podiatry

## 2022-07-17 ENCOUNTER — Ambulatory Visit (INDEPENDENT_AMBULATORY_CARE_PROVIDER_SITE_OTHER): Payer: BC Managed Care – PPO

## 2022-07-17 DIAGNOSIS — M722 Plantar fascial fibromatosis: Secondary | ICD-10-CM | POA: Diagnosis not present

## 2022-07-17 DIAGNOSIS — M79672 Pain in left foot: Secondary | ICD-10-CM | POA: Diagnosis not present

## 2022-07-17 DIAGNOSIS — M79671 Pain in right foot: Secondary | ICD-10-CM | POA: Diagnosis not present

## 2022-07-17 MED ORDER — TRIAMCINOLONE ACETONIDE 10 MG/ML IJ SUSP
20.0000 mg | Freq: Once | INTRAMUSCULAR | Status: AC
  Administered 2022-07-17: 20 mg

## 2022-07-17 MED ORDER — DICLOFENAC SODIUM 75 MG PO TBEC: 75.0000 mg | DELAYED_RELEASE_TABLET | Freq: Two times a day (BID) | ORAL | 2 refills | Status: DC

## 2022-07-17 NOTE — Patient Instructions (Signed)

## 2022-07-18 ENCOUNTER — Other Ambulatory Visit: Payer: Self-pay | Admitting: Podiatry

## 2022-07-18 DIAGNOSIS — M722 Plantar fascial fibromatosis: Secondary | ICD-10-CM

## 2022-07-18 DIAGNOSIS — M79672 Pain in left foot: Secondary | ICD-10-CM

## 2022-07-19 NOTE — Progress Notes (Signed)
Subjective:   Patient ID: Elaine [name], female   DOB: 57 y.o.   MRN: [id number]   HPI Patient presents with a lot of pain in the mid arch area bilateral right over left and states it has been aching and been going on for at least 3 months.  Does not remember injury or other changes in gait.  Patient does not smoke likes to be active   Review of Systems  All other systems reviewed and are negative.       Objective:  Physical Exam Vitals and nursing note reviewed.  Constitutional:      Appearance: She is well-developed.  Pulmonary:     Effort: Pulmonary effort is normal.  Musculoskeletal:        General: Normal range of motion.  Skin:    General: Skin is warm.  Neurological:     Mental Status: She is alert.     Neurovascular status intact muscle strength found to be adequate range of motion adequate with exquisite discomfort in the mid arch area bilateral inflammation fluid right over left within the mid arch no fibro lesions noted.  Good digital perfusion mild equinus and reduced arch height noted     Assessment:  Acute mid arch fasciitis right over left with inflammation     Plan:  H&P x-rays reviewed sterile prep injected the mid arch area bilateral 3 mg Kenalog 5 mg Xylocaine applied fascial brace right instructed on stretching exercises anti-inflammatories and patient will continue taking Mobic.  Reappoint for [place] to recheck again next 2 to 3 weeks and shoe gear modifications recommended  X-rays indicate no signs of spur moderate depression of the arch noted no indication of stress fracture

## 2022-10-18 NOTE — Telephone Encounter (Signed)
Tried to leave vm, she will be a NP

## 2022-11-15 ENCOUNTER — Ambulatory Visit: Payer: BC Managed Care – PPO | Admitting: Family Medicine

## 2022-11-27 ENCOUNTER — Ambulatory Visit: Payer: BC Managed Care – PPO | Admitting: Internal Medicine

## 2023-01-04 ENCOUNTER — Encounter: Payer: Self-pay | Admitting: Internal Medicine

## 2023-01-04 ENCOUNTER — Ambulatory Visit: Payer: BC Managed Care – PPO | Admitting: Internal Medicine

## 2023-01-04 VITALS — BP 124/78 | HR 69 | Temp 98.3°F | Ht 69.0 in | Wt 164.4 lb

## 2023-01-04 DIAGNOSIS — I10 Essential (primary) hypertension: Secondary | ICD-10-CM | POA: Insufficient documentation

## 2023-01-04 DIAGNOSIS — R42 Dizziness and giddiness: Secondary | ICD-10-CM | POA: Diagnosis not present

## 2023-01-04 DIAGNOSIS — D649 Anemia, unspecified: Secondary | ICD-10-CM

## 2023-01-04 DIAGNOSIS — E785 Hyperlipidemia, unspecified: Secondary | ICD-10-CM | POA: Insufficient documentation

## 2023-01-04 DIAGNOSIS — N950 Postmenopausal bleeding: Secondary | ICD-10-CM

## 2023-01-04 DIAGNOSIS — Z1231 Encounter for screening mammogram for malignant neoplasm of breast: Secondary | ICD-10-CM

## 2023-01-04 NOTE — Progress Notes (Signed)
[facility] [address] [place] [address] Dept: [phone] Dept Fax: [phone]  New Patient Office Visit  Subjective:   Elaine [name] Dec 21, 1965 01/04/2023  Chief Complaint  Patient presents with   Establish Care    Feeling off balance started 2 months ago     HPI: Elaine [name] presents today to establish care at [facility]. Introduced to Publishing rights manager role and practice setting.  All questions answered.  Concerns: See below   Discussed the use of AI scribe software for clinical note transcription with the patient, who gave verbal consent to proceed.  History of Present Illness   The patient, with a past medical history of high blood pressure, thyroid nodules, uterine fibroids with excision, and a breast mass with excision, presents to establish care. The patient reports a feeling of being off balance, describing it as a sensation of the room spinning ongoing for the past 2 months. This has led to two falls, one at home and one outside. She did not hit her head with falls. The patient notes that the sensation comes and goes, and seems to be triggered at times with standing up to quickly. The patient denies any associated chest pain, palpitations, difficulty breathing, motor or sensory deficits,  slurred speech, facial droop, vision changes, headaches, N/V, or earache.    The patient also reports a recent episode of vaginal spotting last week, which lasted for a day. The patient's last menstrual period was several years ago, she is postmenopausal. The patient denies any associated abdominal cramping, nausea, or vomiting.      The following portions of the patient's history were reviewed and updated as appropriate: past medical history, past surgical history, family history, social history, allergies, medications, and problem list.   Patient Active Problem List   Diagnosis Date Noted    Anemia 05/21/2019   Elevated LDL cholesterol level 05/21/2019   Abnormal urinalysis 05/21/2019   Cervical radiculopathy 05/09/2019   History of breast lump/mass excision 08/31/2017   Encounter for health maintenance examination with abnormal findings 08/24/2017   Plantar fasciitis 08/24/2017   Arm pain, diffuse, left 08/24/2017   Breast abscess 12/22/2011   Single cyst of left breast 12/20/2011   Left breast mass 05/22/2011   Past Medical History:  Diagnosis Date   Anemia    Endometriosis    Frequent headaches    no migraine symptoms   Heart murmur 2006   echo in epic 03-23-2004   ef 55-65%,  mild MR and mild TR,  no stenosis   Hypertension    not on medication    Multiple thyroid nodules 12/2021   ultrasound in epic 12-16-2021;   one left side nodule meet bx critiria done 01-19-2022,  benign follicular   PMB (postmenopausal bleeding)    Uterine leiomyoma    Past Surgical History:  Procedure Laterality Date   ABLATION ON ENDOMETRIOSIS     BREAST DUCTAL SYSTEM EXCISION Left 06/10/2001   @[facility]  by dr Jamey Ripa   BREAST MASS EXCISION Left 1997   ductal hyperplasia w/ intraductal papilloma   BREAST MASS EXCISION Left 01/09/2014   @[facility]  by dr Shela Commons. Katrinka Blazing;    intraductal papilloma   HYSTEROSCOPY W/ ENDOMETRIAL ABLATION  10/27/2005   @[facility]  by dr Clearance Coots;   w/ bipolar   UMBILICAL HERNIA REPAIR     Family History  Problem Relation Age of Onset   Hypertension Mother    Diabetes Mother    Heart disease  Mother    Cancer Father    Cancer Sister    Pancreatic cancer Sister    Hypertension Sister    Hypertension Sister    Hypertension Sister     Current Outpatient Medications:    Multiple Vitamin (MULTIVITAMIN PO), Take by mouth., Disp: , Rfl:  No Known Allergies  ROS: A complete ROS was performed with pertinent positives/negatives noted in the HPI. The remainder of the ROS are negative.   Objective:   Today's Vitals   01/04/23 1355 01/04/23 1450 01/04/23 1451 01/04/23 1452  BP:  124/78     Pulse: 69     Temp: 98.3 F (36.8 C)     TempSrc: Temporal     SpO2: 98% 99% 99% 99%  Weight: 164 lb 6.4 oz (74.6 kg)     Height: 5\' 9"  (1.753 m)       Orthostatic Bps: 130/90 HR 57 (lying), 130/90 HR 51 (sitting), 142/88 HR 62 (standing)  GENERAL: Well-appearing, in NAD. Well nourished.  SKIN: Pink, warm and dry. No rash, lesion, ulceration, or ecchymoses.  HEENT:    HEAD: Normocephalic, non-traumatic.  EYES: Conjunctive pink without exudate. PERRL, EOMI.  EARS: External ear w/o redness, swelling, masses, or lesions. EAC clear. TM's intact, translucent w/o bulging, appropriate landmarks visualized.  NOSE: Septum midline w/o deformity. Nares patent, mucosa pink and non-inflamed w/o drainage. No sinus tenderness.  THROAT: Uvula midline. Oropharynx clear. Mucus membranes pink and moist.  NECK: Trachea midline. Full ROM w/o pain or tenderness. No lymphadenopathy.  RESPIRATORY: Chest wall symmetrical. Respirations even and non-labored. Breath sounds clear to auscultation bilaterally.  CARDIAC: S1, S2 present, regular rate and rhythm. Peripheral pulses 2+ bilaterally.  MSK: Muscle tone and strength appropriate for age. Joints w/o tenderness, redness, or swelling.  GU: External genitalia without erythema, lesions, or masses. No lymphadenopathy. Vaginal mucosa pink and moist without exudate, lesions, or ulcerations. Cervix pink without discharge. Cervical os closed. Uterus and adnexae palpable, not enlarged, and w/o tenderness. No palpable masses. Chaperoned by Elaine [name] CMA EXTREMITIES: Without clubbing, cyanosis, or edema.  NEUROLOGIC: No motor or sensory deficits. Steady, even gait.  PSYCH/MENTAL STATUS: Alert, oriented x 3. Cooperative, appropriate mood and affect.   Health Maintenance Due  Topic Date Due   Hepatitis C Screening  Never done   Zoster Vaccines- Shingrix (1 of 2) Never done   Colonoscopy  Never done   MAMMOGRAM  12/30/2015    No results found for any  visits on 01/04/23.  Assessment & Plan:  Assessment and Plan    Dizziness/Spinning sensation - orthostatic BP's normal -Order comprehensive metabolic panel (CMP), complete blood count (CBC), and thyroid function tests to rule out other potential causes.  Postmenopausal spotting - US pelvic complete with transvaginal  Hypertension History of hypertension, not currently on medication. Blood pressure at visit within normal range. -Continue to monitor blood pressure.  Hyperlipidemia History of slightly elevated cholesterol, not currently on medication. -Continue to monitor cholesterol levels.  Anemia History of anemia. Obtaining lab work. Could be causing dizziness.   Health Maintenance -Order mammogram, last performed approximately three years ago. - last colonoscopy performed in 2022 at [facility]. Due in 2025.       Orders Placed This Encounter  Procedures   MM DIGITAL SCREENING BILATERAL    Standing Status:   Future    Standing Expiration Date:   01/04/2024    Order Specific Question:   Is the patient pregnant?    Answer:   No  Order Specific Question:   Preferred imaging location?    Answer:   [facility]    Order Specific Question:   Reason for exam:    Answer:   screening mammogram    Order Specific Question:   Release to patient    Answer:   Immediate   US Pelvic Complete With Transvaginal    INSURANCE: BCBS EPIC ORDER [id number] NO SPECIAL NEEDS (WHEELCHAIR/WALKER /OXYGEN) NO GLUCOSE MONITOR / SPINAL CORD STIMULATOR/ INJECTORS  PT AWARE OF $75 NO SHOW FEE PT AWARE TO BRING PHOTO ID & INS CARD    Standing Status:   Future    Standing Expiration Date:   01/04/2024    Order Specific Question:   Reason for Exam (SYMPTOM  OR DIAGNOSIS REQUIRED)    Answer:   post menopausal vaginal spotting    Order Specific Question:   Preferred imaging location?    Answer:   [facility]   Comp Met (CMET)   CBC w/Diff   TSH   Iron, TIBC and Ferritin  Panel   B12 and Folate Panel    Return in about 3 months (around 04/06/2023) for Fasting Annual Physical Exam.   Salvatore Decent, FNP

## 2023-01-05 LAB — COMPREHENSIVE METABOLIC PANEL
ALT: 9 U/L (ref 0–35)
AST: 16 U/L (ref 0–37)
Albumin: 4.1 g/dL (ref 3.5–5.2)
Alkaline Phosphatase: 70 U/L (ref 39–117)
BUN: 19 mg/dL (ref 6–23)
CO2: 29 meq/L (ref 19–32)
Calcium: 9.2 mg/dL (ref 8.4–10.5)
Chloride: 102 meq/L (ref 96–112)
Creatinine, Ser: 1 mg/dL (ref 0.40–1.20)
GFR: 62.15 mL/min (ref >60.00)
Glucose, Bld: 88 mg/dL (ref 70–99)
Potassium: 4.3 meq/L (ref 3.5–5.1)
Sodium: 139 meq/L (ref 135–145)
Total Bilirubin: 0.6 mg/dL (ref 0.2–1.2)
Total Protein: 7 g/dL (ref 6.0–8.3)

## 2023-01-05 LAB — IRON,TIBC AND FERRITIN PANEL
%SAT: 21 % (ref 16–45)
Ferritin: 48 ng/mL (ref 16–232)
Iron: 76 ug/dL (ref 45–160)
TIBC: 361 ug/dL (ref 250–450)

## 2023-01-05 LAB — CBC WITH DIFFERENTIAL/PLATELET
Basophils Absolute: 0 10*3/uL (ref 0.0–0.1)
Basophils Relative: 0.4 % (ref 0.0–3.0)
Eosinophils Absolute: 0.1 10*3/uL (ref 0.0–0.7)
Eosinophils Relative: 1 % (ref 0.0–5.0)
HCT: 33.9 % — ABNORMAL LOW (ref 36.0–46.0)
Hemoglobin: 11 g/dL — ABNORMAL LOW (ref 12.0–15.0)
Lymphocytes Relative: 38.3 % (ref 12.0–46.0)
Lymphs Abs: 2.6 10*3/uL (ref 0.7–4.0)
MCHC: 32.4 g/dL (ref 30.0–36.0)
MCV: 81.4 fL (ref 78.0–100.0)
Monocytes Absolute: 0.3 10*3/uL (ref 0.1–1.0)
Monocytes Relative: 5.1 % (ref 3.0–12.0)
Neutro Abs: 3.8 10*3/uL (ref 1.4–7.7)
Neutrophils Relative %: 55.2 % (ref 43.0–77.0)
Platelets: 217 10*3/uL (ref 150.0–400.0)
RBC: 4.17 Mil/uL (ref 3.87–5.11)
RDW: 13.6 % (ref 11.5–15.5)
WBC: 6.8 10*3/uL (ref 4.0–10.5)

## 2023-01-05 LAB — B12 AND FOLATE PANEL
Folate: 9.1 ng/mL (ref >5.9)
Vitamin B-12: 167 pg/mL — ABNORMAL LOW (ref 211–911)

## 2023-01-05 LAB — TSH: TSH: 0.88 u[IU]/mL (ref 0.35–5.50)

## 2023-01-08 ENCOUNTER — Other Ambulatory Visit: Payer: Self-pay | Admitting: Internal Medicine

## 2023-01-08 DIAGNOSIS — D519 Vitamin B12 deficiency anemia, unspecified: Secondary | ICD-10-CM

## 2023-01-08 MED ORDER — CYANOCOBALAMIN 1000 MCG/ML IJ SOLN
1000.0000 ug | INTRAMUSCULAR | Status: AC
  Administered 2023-01-11 – 2023-01-31 (×4): 1000 ug via INTRAMUSCULAR

## 2023-01-09 ENCOUNTER — Telehealth: Payer: Self-pay | Admitting: Internal Medicine

## 2023-01-09 NOTE — Telephone Encounter (Signed)
Results have been relayed to the patient. The patient verbalized understanding. No questions at this time.  And scheduled for nurse visit

## 2023-01-09 NOTE — Telephone Encounter (Signed)
Pt is stating she got a call concerning B12, please advise pt at [phone]

## 2023-01-11 ENCOUNTER — Ambulatory Visit: Payer: BC Managed Care – PPO

## 2023-01-11 DIAGNOSIS — D519 Vitamin B12 deficiency anemia, unspecified: Secondary | ICD-10-CM

## 2023-01-11 DIAGNOSIS — D649 Anemia, unspecified: Secondary | ICD-10-CM

## 2023-01-11 NOTE — Progress Notes (Signed)
Per orders of Salvatore Decent, injection of B12 given in RT deltoid by Mickle Plumb, cma.  Patient tolerated injection well.  Scheduled for 2nd B12 on 01/16/23 @ 3:40 pm.  Dm/cma

## 2023-01-12 ENCOUNTER — Ambulatory Visit
Admission: RE | Admit: 2023-01-12 | Discharge: 2023-01-12 | Disposition: A | Discharge status: Home / Self Care | Payer: BC Managed Care – PPO | Source: Ambulatory Visit | Attending: Internal Medicine | Admitting: Internal Medicine

## 2023-01-12 ENCOUNTER — Other Ambulatory Visit: Payer: Self-pay | Admitting: Internal Medicine

## 2023-01-12 DIAGNOSIS — R42 Dizziness and giddiness: Secondary | ICD-10-CM

## 2023-01-12 DIAGNOSIS — I10 Essential (primary) hypertension: Secondary | ICD-10-CM

## 2023-01-12 DIAGNOSIS — D649 Anemia, unspecified: Secondary | ICD-10-CM

## 2023-01-12 DIAGNOSIS — E785 Hyperlipidemia, unspecified: Secondary | ICD-10-CM

## 2023-01-12 DIAGNOSIS — N950 Postmenopausal bleeding: Secondary | ICD-10-CM

## 2023-01-12 DIAGNOSIS — Z1231 Encounter for screening mammogram for malignant neoplasm of breast: Secondary | ICD-10-CM

## 2023-01-16 ENCOUNTER — Ambulatory Visit: Payer: BC Managed Care – PPO

## 2023-01-18 ENCOUNTER — Ambulatory Visit: Payer: BC Managed Care – PPO

## 2023-01-18 DIAGNOSIS — D519 Vitamin B12 deficiency anemia, unspecified: Secondary | ICD-10-CM

## 2023-01-18 DIAGNOSIS — D649 Anemia, unspecified: Secondary | ICD-10-CM

## 2023-01-18 NOTE — Progress Notes (Signed)
After obtaining consent, and per orders of  Salvatore Decent, injection of B12   given IM RT deltoid by Jenniah Bhavsar L Man Bonneau. Patient instructed to remain in clinic for 20 minutes afterwards, and to report any adverse reaction to me immediately. PT tolerated well.

## 2023-01-25 ENCOUNTER — Ambulatory Visit: Payer: BC Managed Care – PPO

## 2023-01-25 DIAGNOSIS — D519 Vitamin B12 deficiency anemia, unspecified: Secondary | ICD-10-CM | POA: Diagnosis not present

## 2023-01-25 DIAGNOSIS — D649 Anemia, unspecified: Secondary | ICD-10-CM

## 2023-01-25 NOTE — Progress Notes (Signed)
Pt here for monthly B12 injection per   B12 given IM Left deltoid and pt tolerated injection well.  Next B12 injection scheduled for

## 2023-01-30 ENCOUNTER — Other Ambulatory Visit: Payer: Self-pay | Admitting: Internal Medicine

## 2023-01-30 ENCOUNTER — Encounter: Payer: Self-pay | Admitting: Internal Medicine

## 2023-01-30 DIAGNOSIS — D251 Intramural leiomyoma of uterus: Secondary | ICD-10-CM | POA: Insufficient documentation

## 2023-01-30 DIAGNOSIS — N950 Postmenopausal bleeding: Secondary | ICD-10-CM | POA: Insufficient documentation

## 2023-01-31 ENCOUNTER — Ambulatory Visit (INDEPENDENT_AMBULATORY_CARE_PROVIDER_SITE_OTHER): Payer: BC Managed Care – PPO

## 2023-01-31 DIAGNOSIS — D519 Vitamin B12 deficiency anemia, unspecified: Secondary | ICD-10-CM

## 2023-01-31 NOTE — Progress Notes (Signed)
Pt here for monthly B12 injection per   B12 given IM left deltoid  and pt tolerated injection well.

## 2023-02-21 ENCOUNTER — Ambulatory Visit: Payer: BC Managed Care – PPO | Admitting: Internal Medicine

## 2023-02-21 ENCOUNTER — Telehealth: Payer: Self-pay

## 2023-02-21 ENCOUNTER — Ambulatory Visit: Payer: BC Managed Care – PPO

## 2023-02-21 ENCOUNTER — Encounter: Payer: Self-pay | Admitting: Internal Medicine

## 2023-02-21 VITALS — BP 132/82 | HR 67 | Temp 98.1°F | Ht 69.0 in | Wt 163.2 lb

## 2023-02-21 DIAGNOSIS — R5383 Other fatigue: Secondary | ICD-10-CM

## 2023-02-21 DIAGNOSIS — D519 Vitamin B12 deficiency anemia, unspecified: Secondary | ICD-10-CM | POA: Diagnosis not present

## 2023-02-21 DIAGNOSIS — Z1231 Encounter for screening mammogram for malignant neoplasm of breast: Secondary | ICD-10-CM

## 2023-02-21 DIAGNOSIS — D251 Intramural leiomyoma of uterus: Secondary | ICD-10-CM | POA: Diagnosis not present

## 2023-02-21 LAB — CBC WITH DIFFERENTIAL/PLATELET
Basophils Absolute: 0 10*3/uL (ref 0.0–0.1)
Basophils Relative: 0.8 % (ref 0.0–3.0)
Eosinophils Absolute: 0 10*3/uL (ref 0.0–0.7)
Eosinophils Relative: 0.7 % (ref 0.0–5.0)
HCT: 36.7 % (ref 36.0–46.0)
Hemoglobin: 11.8 g/dL — ABNORMAL LOW (ref 12.0–15.0)
Lymphocytes Relative: 36.7 % (ref 12.0–46.0)
Lymphs Abs: 2.1 10*3/uL (ref 0.7–4.0)
MCHC: 32.1 g/dL (ref 30.0–36.0)
MCV: 82.9 fL (ref 78.0–100.0)
Monocytes Absolute: 0.4 10*3/uL (ref 0.1–1.0)
Monocytes Relative: 7 % (ref 3.0–12.0)
Neutro Abs: 3.2 10*3/uL (ref 1.4–7.7)
Neutrophils Relative %: 54.8 % (ref 43.0–77.0)
Platelets: 223 10*3/uL (ref 150.0–400.0)
RBC: 4.42 Mil/uL (ref 3.87–5.11)
RDW: 14.2 % (ref 11.5–15.5)
WBC: 5.8 10*3/uL (ref 4.0–10.5)

## 2023-02-21 LAB — VITAMIN D 25 HYDROXY (VIT D DEFICIENCY, FRACTURES): VITD: 11.72 ng/mL — ABNORMAL LOW (ref 30.00–100.00)

## 2023-02-21 LAB — VITAMIN B12: Vitamin B-12: 381 pg/mL (ref 211–911)

## 2023-02-21 NOTE — Telephone Encounter (Signed)
After reviewing patient's chart on [place] schedule for b12. I saw where PCP noted under b12 labs 01/04/23 "Need to start B12 injections. She needs to come to the office once a week for 4 weeks for the injections, AND follow up with me in 6 weeks (after she has completed all 4 injections).". Pt completed the four injections during the month of November (01/11/2023,01/18/2023, 01/25/2023, 01/31/2023). I spoke with PCP verbally to clarify that patient should have an OV to f/u on b12 per her note and she confirmed.   I reached out to pt to reschedule her appt to an OV at 9:40am today with PCP to follow up. Pt verbalized understanding.

## 2023-02-21 NOTE — Progress Notes (Signed)
[facility] [address] [place] [address] Dept: [phone] Dept Fax: [phone]    Subjective:   [name] 04/06/1965 02/21/2023  Chief Complaint  Patient presents with   Follow-up    HPI: Elaine [name] presents today for re-assessment and management of chronic medical conditions.  Discussed the use of AI scribe software for clinical note transcription with the patient, who gave verbal consent to proceed.  History of Present Illness   The patient, with a history of B12 deficiency anemia, presents for follow-up after completing a course of weekly B12 injections for four weeks. She reports resolution of previous dizziness but has developed significant fatigue. She describes feeling 'more tired' since starting the B12 injections, often going to bed by 6pm, even during a recent two-week vacation. She works at [employer], which she acknowledges is stressful, but her fatigue persists even when not working.  In addition to the fatigue, the patient has been experiencing intermittent post-menopausal spotting. Recent ultrasound revealed a 3cm uterine fibroid, which is suspected to be the cause of the spotting. She has not yet scheduled a follow-up appointment with her OBGYN. She states she will call her OB/GYN at [facility] to schedule. She is due for her mammogram., order was placed at prior visit but patient has not been scheduled.       The following portions of the patient's history were reviewed and updated as appropriate: past medical history, past surgical history, family history, social history, allergies, medications, and problem list.   Patient Active Problem List   Diagnosis Date Noted   Intramural leiomyoma of uterus 01/30/2023   Postmenopausal vaginal bleeding 01/30/2023   Hyperlipidemia 01/04/2023   Primary hypertension 01/04/2023   Anemia 05/21/2019   Elevated LDL cholesterol level 05/21/2019    Abnormal urinalysis 05/21/2019   Cervical radiculopathy 05/09/2019   History of breast lump/mass excision 08/31/2017   Encounter for health maintenance examination with abnormal findings 08/24/2017   Plantar fasciitis 08/24/2017   Arm pain, diffuse, left 08/24/2017   Breast abscess 12/22/2011   Single cyst of left breast 12/20/2011   Left breast mass 05/22/2011   Past Medical History:  Diagnosis Date   Anemia    Endometriosis    Frequent headaches    no migraine symptoms   Heart murmur 2006   echo in epic 03-23-2004   ef 55-65%,  mild MR and mild TR,  no stenosis   Hypertension    not on medication    Multiple thyroid nodules 12/2021   ultrasound in epic 12-16-2021;   one left side nodule meet bx critiria done 01-19-2022,  benign follicular   PMB (postmenopausal bleeding)    Uterine leiomyoma    Past Surgical History:  Procedure Laterality Date   ABLATION ON ENDOMETRIOSIS     BREAST DUCTAL SYSTEM EXCISION Left 06/10/2001   @[facility]  by dr Jamey Ripa   BREAST MASS EXCISION Left 1997   ductal hyperplasia w/ intraductal papilloma   BREAST MASS EXCISION Left 01/09/2014   @[facility]  by dr Shela Commons. Katrinka Blazing;    intraductal papilloma   HYSTEROSCOPY W/ ENDOMETRIAL ABLATION  10/27/2005   @[facility]  by dr Clearance Coots;   w/ bipolar   UMBILICAL HERNIA REPAIR     Family History  Problem Relation Age of Onset   Hypertension Mother    Diabetes Mother    Heart disease Mother    Cancer Father    Cancer Sister    Pancreatic cancer Sister    Hypertension Sister  Hypertension Sister    Hypertension Sister     Current Outpatient Medications:    Multiple Vitamin (MULTIVITAMIN PO), Take by mouth., Disp: , Rfl:  No Known Allergies   ROS: A complete ROS was performed with pertinent positives/negatives noted in the HPI. The remainder of the ROS are negative.    Objective:   Today's Vitals   02/21/23 0943  BP: 132/82  Pulse: 67  Temp: 98.1 F (36.7 C)  TempSrc: Temporal  SpO2: 99%  Weight: 163 lb 3.2 oz (74  kg)  Height: 5\' 9"  (1.753 m)    GENERAL: Well-appearing, in NAD. Well nourished.  SKIN: Pink, warm and dry. No rash, lesion, ulceration, or ecchymoses.  NECK: Trachea midline. Full ROM w/o pain or tenderness. No lymphadenopathy.  RESPIRATORY: Chest wall symmetrical. Respirations even and non-labored. Breath sounds clear to auscultation bilaterally.  CARDIAC: S1, S2 present, regular rate and rhythm. Peripheral pulses 2+ bilaterally.  EXTREMITIES: Without clubbing, cyanosis, or edema.  NEUROLOGIC: Steady, even gait.  PSYCH/MENTAL STATUS: Alert, oriented x 3. Cooperative, appropriate mood and affect.   Health Maintenance Due  Topic Date Due   COVID-19 Vaccine (1) Never done   Zoster Vaccines- Shingrix (1 of 2) Never done   Colonoscopy  Never done   MAMMOGRAM  12/30/2015    No results found for any visits on 02/21/23.  The 10-year ASCVD risk score (Arnett DK, et al., 2019) is: 7.1%     Assessment & Plan:  Assessment and Plan    Vitamin B12 Deficiency Completed 4 weeks of weekly B12 injections. Dizziness resolved, but persistent fatigue reported. -Check B12 level today to assess response to treatment.  Fatigue Persistent despite B12 treatment. No clear etiology identified yet. -Check Vitamin D level today as low levels can contribute to fatigue. -Recheck CBC to assess for anemia.  Uterine Fibroid Identified on recent ultrasound, associated with spotting. -Referral to OBGYN already placed. Patient advised to call OBGYN office if no contact within a week.  Breast Cancer Screening Mammogram not yet scheduled. -Order mammogram at [facility]. Patient advised to call office if not contacted within two weeks for scheduling.  Follow-up in 3 months to reassess fatigue and ensure completion of mammogram and OBGYN consultation.      Orders Placed This Encounter  Procedures   MM 3D SCREENING MAMMOGRAM BILATERAL BREAST    Standing Status:   Future    Expiration Date:    02/21/2024    Reason for Exam (SYMPTOM  OR DIAGNOSIS REQUIRED):   screening for breast cancer    Preferred imaging location?:   [facility]    Is the patient pregnant?:   No   B12   VITAMIN D 25 Hydroxy (Vit-D Deficiency, Fractures)   CBC with Differential/Platelet   No images are attached to the encounter or orders placed in the encounter. No orders of the defined types were placed in this encounter.   Return in about 3 months (around 05/22/2023) for Anemia, Hypertension, Cholesterol, health maintenance.   Salvatore Decent, FNP

## 2023-02-23 ENCOUNTER — Other Ambulatory Visit: Payer: Self-pay | Admitting: Internal Medicine

## 2023-02-23 DIAGNOSIS — E559 Vitamin D deficiency, unspecified: Secondary | ICD-10-CM | POA: Insufficient documentation

## 2023-02-23 MED ORDER — VITAMIN D (ERGOCALCIFEROL) 1.25 MG (50000 UNIT) PO CAPS: 50000.0000 [IU] | ORAL_CAPSULE | ORAL | 1 refills | Status: AC

## 2023-03-14 ENCOUNTER — Ambulatory Visit: Payer: BC Managed Care – PPO

## 2023-03-19 ENCOUNTER — Ambulatory Visit: Payer: BC Managed Care – PPO

## 2023-04-12 ENCOUNTER — Encounter: Payer: Self-pay | Admitting: Obstetrics and Gynecology

## 2023-04-12 ENCOUNTER — Ambulatory Visit (INDEPENDENT_AMBULATORY_CARE_PROVIDER_SITE_OTHER): Payer: 59 | Admitting: Obstetrics and Gynecology

## 2023-04-12 VITALS — BP 127/71 | HR 78 | Ht 69.0 in | Wt 168.0 lb

## 2023-04-12 DIAGNOSIS — N950 Postmenopausal bleeding: Secondary | ICD-10-CM | POA: Diagnosis not present

## 2023-04-12 NOTE — Progress Notes (Signed)
 NEW GYNECOLOGY PATIENT Patient name: Elaine [name] MRN [id number]  Date of birth: [birth year] Chief Complaint:   New Patient (Initial Visit)     History:  Elaine [name] is a 57 y.o. [id number] being seen today for PMB (spotting).  Told her primary care about intermittent spotting and may have a month without anything and will see intermittent bright red bleeding. First time having spotting since ablation.   Had been having dizzy spells and started on iron and B12 and those . No hot flashes no vaginal dryness. No monthly symptoms . Once she had the ablation mense scompleted stopped.   Per chart review: pelvic US  noted to have feeder vessel concerning for polyp in 2023, scheduled fo hysteroscopy and then cancelled due to cost. Hx of endometrial ablation in 2007       Gynecologic History No LMP recorded. Patient has had an ablation. Last Pap:     Component Value Date/Time   DIAGPAP  11/29/2021 1553    - Negative for intraepithelial lesion or malignancy (NILM)   DIAGPAP  05/09/2019 1138    - Negative for intraepithelial lesion or malignancy (NILM)   HPVHIGH Negative 11/29/2021 1553   ADEQPAP  11/29/2021 1553    Satisfactory for evaluation; transformation zone component PRESENT.   ADEQPAP  05/09/2019 1138    Satisfactory for evaluation; transformation zone component PRESENT.    High Risk HPV: Positive  Adequacy:  Satisfactory for evaluation, transformation zone component PRESENT  Diagnosis:  Atypical squamous cells of undetermined significance (ASC-US )  Last Mammogram:  none seen on file Last Colonoscopy:  not see on file  Obstetric History OB History  Gravida Para Term Preterm AB Living  3    1 2   SAB IAB Ectopic Multiple Live Births  1        # Outcome Date GA Lbr Len/2nd Weight Sex Type Anes PTL Lv  3 Gravida           2 Gravida           1 SAB             Past Medical History:  Diagnosis Date   Anemia    Endometriosis    Frequent headaches    no migraine symptoms    Heart murmur 2006   echo in epic 03-23-2004   ef 55-65%,  mild MR and mild TR,  no stenosis   Hypertension    not on medication    Multiple thyroid  nodules 12/2021   ultrasound in epic 12-16-2021;   one left side nodule meet bx critiria done 01-19-2022,  benign follicular   PMB (postmenopausal bleeding)    Uterine leiomyoma     Past Surgical History:  Procedure Laterality Date   ABLATION ON ENDOMETRIOSIS     BREAST DUCTAL SYSTEM EXCISION Left 06/10/2001   @[facility]  by dr merrilyn   BREAST MASS EXCISION Left 1997   ductal hyperplasia w/ intraductal papilloma   BREAST MASS EXCISION Left 01/09/2014   @[facility]  by dr jinny. claudene;    intraductal papilloma   HYSTEROSCOPY W/ ENDOMETRIAL ABLATION  10/27/2005   @[facility]  by dr rudy;   w/ bipolar   UMBILICAL HERNIA REPAIR      Current Outpatient Medications on File Prior to Visit  Medication Sig Dispense Refill   Multiple Vitamin (MULTIVITAMIN PO) Take by mouth.     Vitamin D , Ergocalciferol , (DRISDOL ) 1.25 MG (50000 UNIT) CAPS capsule Take 1 capsule (50,000 Units total) by mouth every 7 (seven) days. 12 capsule 1  No current facility-administered medications on file prior to visit.    No Known Allergies  Social History:  reports that she has never smoked. She has never used smokeless tobacco. She reports that she does not drink alcohol and does not use drugs.  Family History  Problem Relation Age of Onset   Hypertension Mother    Diabetes Mother    Heart disease Mother    Cancer Father    Cancer Sister    Pancreatic cancer Sister    Hypertension Sister    Hypertension Sister    Hypertension Sister     The following portions of the patient's history were reviewed and updated as appropriate: allergies, current medications, past family history, past medical history, past social history, past surgical history and problem list.  Review of Systems Pertinent items noted in HPI and remainder of comprehensive ROS otherwise negative.  Physical  Exam:  BP 127/71   Pulse 78   Ht 5' 9 (1.753 m)   Wt 168 lb (76.2 kg)   BMI 24.81 kg/m  Physical Exam Vitals and nursing note reviewed.  Constitutional:      Appearance: Normal appearance.  Cardiovascular:     Rate and Rhythm: Normal rate.  Pulmonary:     Effort: Pulmonary effort is normal.     Breath sounds: Normal breath sounds.  Neurological:     General: No focal deficit present.     Mental Status: She is alert and oriented to person, place, and time.  Psychiatric:        Mood and Affect: Mood normal.        Behavior: Behavior normal.        Thought Content: Thought content normal.        Judgment: Judgment normal.    Pelvic ultrasound   Indications: PMP bleeding, h/o endometrial ablation   Findings:   Uterus 6.50 x 6.09 x 3.79 cm 2.40 x 2.37 cm intramural, subserosal myoma   Endometrium 2.76 mm, irregular, feeder vessel seen within the endometrial stripe   Left ovary 2.09 x 0.57 x 0.95 cm   Right ovary 2.55 x 1.46 x 1.10 cm   No free fluid   Impression:  Normal sized anteverted uterus Small intramural/subserosal myoma Irregular endometrial stripe of 2.8 mm, feeder vessel noted Atrophic appearing ovaries bilaterally No adnexal masses   Assessment and Plan:   1. Postmenopausal vaginal bleeding (Primary) Noted on prior imaging there was likely polyp within lining of endometrium. Given history of endometrial ablation and ultrasound finding, would recommend re-scheduling hysteroscopy. Patient agreeable to proceeding with scheduling procedure.  - Ambulatory Referral For Surgery Scheduling   Routine preventative health maintenance measures emphasized. Please refer to After Visit Summary for other counseling recommendations.   Follow-up: No follow-ups on file.      Carter Quarry, MD Obstetrician & Gynecologist, [facility] [employer], [facility]

## 2023-04-12 NOTE — Patient Instructions (Signed)
 Operative hysteroscopy to remove possible polyp and get sample from inside the uterus to understand why you keep having this random spotting/bleeding.

## 2023-05-01 ENCOUNTER — Telehealth: Payer: Self-pay

## 2023-05-01 NOTE — Telephone Encounter (Signed)
 Called patient to see if she was available for surgery on 06/11/23 w/ Dr. Briscoe Deutscher. Left voicemail asking patient to call me to schedule at [phone].

## 2023-05-09 ENCOUNTER — Telehealth: Payer: Self-pay

## 2023-05-09 NOTE — Telephone Encounter (Signed)
 Patient left me a voicemail granting me permission to  schedule her surgery w/ Dr. Briscoe Deutscher on 06/11/23. Patient asked that I call her back once surgery details are finalized.

## 2023-06-07 ENCOUNTER — Telehealth: Payer: Self-pay

## 2023-06-07 ENCOUNTER — Encounter (HOSPITAL_COMMUNITY): Payer: Self-pay | Admitting: Obstetrics and Gynecology

## 2023-06-07 ENCOUNTER — Other Ambulatory Visit: Payer: Self-pay

## 2023-06-07 NOTE — Telephone Encounter (Signed)
 Patient left me a voicemail requesting the CPT codes for her Jul 09, 2023 procedure. I left the CPT code details on patient's [phone]).

## 2023-06-07 NOTE — Progress Notes (Signed)
 PCP - Salvatore Decent, FNP   Cardiologist - denies  PPM/ICD - denies Device Orders - n.a Rep Notified - n.a  Chest x-ray - denies EKG - denies (DOS) Stress Test - denies ECHO - 03-23-04 Cardiac Cath - denies  CPAP - denies  DM - denies  Blood Thinner Instructions: denies Aspirin Instructions: n.a  ERAS Protcol - NPO  COVID TEST- n.a  Anesthesia review:no  Patient verbally denies any shortness of breath, fever, cough and chest pain during phone call   -------------  SDW INSTRUCTIONS given:  Your procedure is scheduled on April 7,2025.  Report to [facility] Main Entrance "A" at 8:00 A.M., and check in at the Admitting office.  Call this number if you have problems the morning of surgery:  [phone]   Remember:  Do not eat or drink after midnight the night before your surgery     Take these medicines the morning of surgery with A SIP OF WATER NONE  As of today, STOP taking any Aspirin (unless otherwise instructed by your surgeon) Aleve, Naproxen, Ibuprofen, Motrin, Advil, Goody's, BC's, all herbal medications, fish oil, and all vitamins.                      Do not wear jewelry, make up, or nail polish            Do not wear lotions, powders, perfumes/colognes, or deodorant.            Do not shave 48 hours prior to surgery.  Men may shave face and neck.            Do not bring valuables to the hospital.            [facility] is not responsible for any belongings or valuables.  Do NOT Smoke (Tobacco/Vaping) 24 hours prior to your procedure If you use a CPAP at night, you may bring all equipment for your overnight stay.   Contacts, glasses, dentures or bridgework may not be worn into surgery.      For patients admitted to the hospital, discharge time will be determined by your treatment team.   Patients discharged the day of surgery will not be allowed to drive home, and someone needs to stay with them for 24 hours.    Special instructions:   [facility]-  Preparing For Surgery  Before surgery, you can play an important role. Because skin is not sterile, your skin needs to be as free of germs as possible. You can reduce the number of germs on your skin by washing with CHG (chlorahexidine gluconate) Soap before surgery.  CHG is an antiseptic cleaner which kills germs and bonds with the skin to continue killing germs even after washing.    Oral Hygiene is also important to reduce your risk of infection.  Remember - BRUSH YOUR TEETH THE MORNING OF SURGERY WITH YOUR REGULAR TOOTHPASTE  Please do not use if you have an allergy to CHG or antibacterial soaps. If your skin becomes reddened/irritated stop using the CHG.  Do not shave (including legs and underarms) for at least 48 hours prior to first CHG shower. It is OK to shave your face.  Please follow these instructions carefully.   Shower the NIGHT BEFORE SURGERY and the MORNING OF SURGERY with DIAL Soap.   Pat yourself dry with a CLEAN TOWEL.  Wear CLEAN PAJAMAS to bed the night before surgery  Place CLEAN SHEETS on your bed the night of your  first shower and DO NOT SLEEP WITH PETS.   Day of Surgery: Please shower morning of surgery  Wear Clean/Comfortable clothing the morning of surgery Do not apply any deodorants/lotions.   Remember to brush your teeth WITH YOUR REGULAR TOOTHPASTE.   Questions were answered. Patient verbalized understanding of instructions.

## 2023-06-11 ENCOUNTER — Ambulatory Visit (HOSPITAL_COMMUNITY)

## 2023-06-11 ENCOUNTER — Encounter (HOSPITAL_COMMUNITY): Payer: Self-pay | Admitting: Obstetrics and Gynecology

## 2023-06-11 ENCOUNTER — Other Ambulatory Visit: Payer: Self-pay

## 2023-06-11 ENCOUNTER — Ambulatory Visit (HOSPITAL_COMMUNITY): Payer: Self-pay | Admitting: Anesthesiology

## 2023-06-11 ENCOUNTER — Ambulatory Visit (HOSPITAL_COMMUNITY)
Admission: RE | Admit: 2023-06-11 | Discharge: 2023-06-11 | Disposition: A | Discharge status: Home / Self Care | Attending: Obstetrics and Gynecology | Admitting: Obstetrics and Gynecology

## 2023-06-11 ENCOUNTER — Encounter (HOSPITAL_COMMUNITY)
Admission: RE | Disposition: A | Discharge status: Home / Self Care | Payer: Self-pay | Source: Home / Self Care | Attending: Obstetrics and Gynecology

## 2023-06-11 ENCOUNTER — Ambulatory Visit (HOSPITAL_BASED_OUTPATIENT_CLINIC_OR_DEPARTMENT_OTHER): Payer: Self-pay | Admitting: Anesthesiology

## 2023-06-11 DIAGNOSIS — N950 Postmenopausal bleeding: Secondary | ICD-10-CM | POA: Diagnosis present

## 2023-06-11 DIAGNOSIS — I10 Essential (primary) hypertension: Secondary | ICD-10-CM | POA: Diagnosis not present

## 2023-06-11 DIAGNOSIS — I440 Atrioventricular block, first degree: Secondary | ICD-10-CM | POA: Insufficient documentation

## 2023-06-11 DIAGNOSIS — R519 Headache, unspecified: Secondary | ICD-10-CM | POA: Diagnosis not present

## 2023-06-11 HISTORY — PX: HYSTEROSCOPY WITH D & C: SHX1775

## 2023-06-11 LAB — BASIC METABOLIC PANEL WITH GFR
Anion gap: 11 (ref 5–15)
BUN: 13 mg/dL (ref 6–20)
CO2: 23 mmol/L (ref 22–32)
Calcium: 9 mg/dL (ref 8.9–10.3)
Chloride: 105 mmol/L (ref 98–111)
Creatinine, Ser: 0.82 mg/dL (ref 0.44–1.00)
GFR, Estimated: >60 mL/min (ref >60)
Glucose, Bld: 94 mg/dL (ref 70–99)
Potassium: 3.9 mmol/L (ref 3.5–5.1)
Sodium: 139 mmol/L (ref 135–145)

## 2023-06-11 LAB — CBC
HCT: 38.2 % (ref 36.0–46.0)
Hemoglobin: 12.1 g/dL (ref 12.0–15.0)
MCH: 26.7 pg (ref 26.0–34.0)
MCHC: 31.7 g/dL (ref 30.0–36.0)
MCV: 84.1 fL (ref 80.0–100.0)
Platelets: 199 10*3/uL (ref 150–400)
RBC: 4.54 MIL/uL (ref 3.87–5.11)
RDW: 13.3 % (ref 11.5–15.5)
WBC: 5.6 10*3/uL (ref 4.0–10.5)
nRBC: 0 % (ref 0.0–0.2)

## 2023-06-11 SURGERY — DILATATION AND CURETTAGE /HYSTEROSCOPY
Anesthesia: General | Site: Uterus | Laterality: Bilateral

## 2023-06-11 MED ORDER — KETOROLAC TROMETHAMINE 30 MG/ML IJ SOLN
INTRAMUSCULAR | Status: DC | PRN
Start: 2023-06-11 — End: 2023-06-11
  Administered 2023-06-11: 30 mg via INTRAVENOUS

## 2023-06-11 MED ORDER — FENTANYL CITRATE (PF) 250 MCG/5ML IJ SOLN
INTRAMUSCULAR | Status: DC | PRN
  Administered 2023-06-11 (×3): 50 ug via INTRAVENOUS

## 2023-06-11 MED ORDER — FENTANYL CITRATE (PF) 250 MCG/5ML IJ SOLN
INTRAMUSCULAR | Status: AC
  Filled 2023-06-11: qty 5

## 2023-06-11 MED ORDER — LACTATED RINGERS IV SOLN: INTRAVENOUS | Status: DC

## 2023-06-11 MED ORDER — OXYCODONE HCL 5 MG PO TABS: 5.0000 mg | ORAL_TABLET | Freq: Once | ORAL | Status: DC | PRN

## 2023-06-11 MED ORDER — ACETAMINOPHEN 500 MG PO TABS
1000.0000 mg | ORAL_TABLET | Freq: Once | ORAL | Status: AC
  Administered 2023-06-11: 1000 mg via ORAL

## 2023-06-11 MED ORDER — PROPOFOL 10 MG/ML IV BOLUS
INTRAVENOUS | Status: DC | PRN
Start: 2023-06-11 — End: 2023-06-11
  Administered 2023-06-11: 150 mg via INTRAVENOUS

## 2023-06-11 MED ORDER — PROPOFOL 10 MG/ML IV BOLUS
INTRAVENOUS | Status: AC
  Filled 2023-06-11: qty 20

## 2023-06-11 MED ORDER — ONDANSETRON HCL 4 MG/2ML IJ SOLN
INTRAMUSCULAR | Status: DC | PRN
  Administered 2023-06-11: 4 mg via INTRAVENOUS

## 2023-06-11 MED ORDER — LIDOCAINE 2% (20 MG/ML) 5 ML SYRINGE
INTRAMUSCULAR | Status: DC | PRN
  Administered 2023-06-11: 60 mg via INTRAVENOUS

## 2023-06-11 MED ORDER — PHENYLEPHRINE 80 MCG/ML (10ML) SYRINGE FOR IV PUSH (FOR BLOOD PRESSURE SUPPORT)
PREFILLED_SYRINGE | INTRAVENOUS | Status: DC | PRN
  Administered 2023-06-11 (×2): 80 ug via INTRAVENOUS
  Administered 2023-06-11: 160 ug via INTRAVENOUS
  Administered 2023-06-11: 80 ug via INTRAVENOUS

## 2023-06-11 MED ORDER — FENTANYL CITRATE (PF) 100 MCG/2ML IJ SOLN: 25.0000 ug | INTRAMUSCULAR | Status: DC | PRN

## 2023-06-11 MED ORDER — DEXAMETHASONE SODIUM PHOSPHATE 10 MG/ML IJ SOLN
INTRAMUSCULAR | Status: DC | PRN
  Administered 2023-06-11: 10 mg via INTRAVENOUS

## 2023-06-11 MED ORDER — OXYCODONE HCL 5 MG/5ML PO SOLN: 5.0000 mg | Freq: Once | ORAL | Status: DC | PRN

## 2023-06-11 MED ORDER — MIDAZOLAM HCL 2 MG/2ML IJ SOLN
INTRAMUSCULAR | Status: AC
  Filled 2023-06-11: qty 2

## 2023-06-11 MED ORDER — CHLORHEXIDINE GLUCONATE 0.12 % MT SOLN
15.0000 mL | Freq: Once | OROMUCOSAL | Status: AC
  Administered 2023-06-11: 15 mL via OROMUCOSAL
  Filled 2023-06-11: qty 15

## 2023-06-11 MED ORDER — AMISULPRIDE (ANTIEMETIC) 5 MG/2ML IV SOLN: 10.0000 mg | Freq: Once | INTRAVENOUS | Status: DC | PRN

## 2023-06-11 MED ORDER — ACETAMINOPHEN 500 MG PO TABS
1000.0000 mg | ORAL_TABLET | ORAL | Status: DC
  Filled 2023-06-11: qty 2

## 2023-06-11 MED ORDER — BUPIVACAINE HCL (PF) 0.5 % IJ SOLN
INTRAMUSCULAR | Status: DC | PRN
  Administered 2023-06-11: 20 mL

## 2023-06-11 MED ORDER — ORAL CARE MOUTH RINSE: 15.0000 mL | Freq: Once | OROMUCOSAL | Status: AC

## 2023-06-11 MED ORDER — BUPIVACAINE HCL (PF) 0.5 % IJ SOLN
INTRAMUSCULAR | Status: AC
Start: 2023-06-11 — End: ?
  Filled 2023-06-11: qty 30

## 2023-06-11 MED ORDER — MIDAZOLAM HCL 2 MG/2ML IJ SOLN
INTRAMUSCULAR | Status: DC | PRN
  Administered 2023-06-11: 2 mg via INTRAVENOUS

## 2023-06-11 SURGICAL SUPPLY — 16 items
CATH ROBINSON RED A/P 16FR (CATHETERS) IMPLANT
DEVICE MYOSURE LITE (MISCELLANEOUS) IMPLANT
DEVICE MYOSURE REACH (MISCELLANEOUS) IMPLANT
GAUZE 4X4 16PLY ~~LOC~~+RFID DBL (SPONGE) IMPLANT
GLOVE BIOGEL PI IND STRL 7.0 (GLOVE) IMPLANT
GLOVE ECLIPSE 7.0 STRL STRAW (GLOVE) ×2 IMPLANT
GOWN STRL REUS W/ TWL XL LVL3 (GOWN DISPOSABLE) ×2 IMPLANT
GOWN STRL REUS W/TWL LRG LVL3 (GOWN DISPOSABLE) ×2 IMPLANT
KIT PROCEDURE FLUENT (KITS) ×2 IMPLANT
KIT TURNOVER KIT B (KITS) ×2 IMPLANT
MYOSURE XL FIBROID (MISCELLANEOUS) IMPLANT
PACK VAGINAL MINOR WOMEN LF (CUSTOM PROCEDURE TRAY) ×2 IMPLANT
PAD OB MATERNITY 11 LF (PERSONAL CARE ITEMS) ×2 IMPLANT
SEAL CERVICAL OMNI LOK (ABLATOR) IMPLANT
SEAL ROD LENS SCOPE MYOSURE (ABLATOR) ×2 IMPLANT
SYSTEM TISS REMOVAL MYOSURE XL (MISCELLANEOUS) IMPLANT

## 2023-06-11 NOTE — Anesthesia Procedure Notes (Signed)
 Procedure Name: LMA Insertion Date/Time: 06/11/2023 10:40 AM  Performed by: Stanton Kidney, CRNAPre-anesthesia Checklist: Patient identified, Patient being monitored, Timeout performed, Emergency Drugs available and Suction available Patient Re-evaluated:Patient Re-evaluated prior to induction Oxygen Delivery Method: Circle system utilized Preoxygenation: Pre-oxygenation with 100% oxygen Induction Type: IV induction Ventilation: Mask ventilation without difficulty LMA: LMA inserted Tube type: Oral Number of attempts: 1 Placement Confirmation: positive ETCO2 and breath sounds checked- equal and bilateral Tube secured with: Tape Dental Injury: Teeth and Oropharynx as per pre-operative assessment  Comments: Atraumatic dentition unchanged

## 2023-06-11 NOTE — Brief Op Note (Signed)
 06/11/2023  11:28 AM  PATIENT:  Elaine [name]  58 y.o. female  PRE-OPERATIVE DIAGNOSIS:  Postmenopausal vaginal bleeding  POST-OPERATIVE DIAGNOSIS:  Postmenopausal vaginal bleeding  PROCEDURE:  Procedure(s): DILATATION AND CURETTAGE /HYSTEROSCOPY (Bilateral) ULTRASOUND GUIDANCE  SURGEON:  Surgeons and Role:    Lorriane Shire, MD - Primary  PHYSICIAN ASSISTANT: n/a  ASSISTANTS: none   ANESTHESIA:   general and paracervical block  EBL:  25 mL   BLOOD ADMINISTERED:none  DRAINS: none   LOCAL MEDICATIONS USED:  BUPIVICAINE   SPECIMEN:  Source of Specimen:  endometrial curettings  DISPOSITION OF SPECIMEN:  PATHOLOGY  COUNTS:  YES  TOURNIQUET:  * No tourniquets in log *  DICTATION: .Note written in EPIC  PLAN OF CARE: Discharge to home after PACU  PATIENT DISPOSITION:  PACU - hemodynamically stable.   Delay start of Pharmacological VTE agent (>24hrs) due to surgical blood loss or risk of bleeding: not applicable

## 2023-06-11 NOTE — Anesthesia Postprocedure Evaluation (Signed)
 Anesthesia Post Note  Patient: Elaine [name]  Procedure(s) Performed: DILATATION AND CURETTAGE /HYSTEROSCOPY (Bilateral: Uterus) ULTRASOUND GUIDANCE (Abdomen)     Patient location during evaluation: PACU Anesthesia Type: General Level of consciousness: awake and alert Pain management: pain level controlled Vital Signs Assessment: post-procedure vital signs reviewed and stable Respiratory status: spontaneous breathing, nonlabored ventilation, respiratory function stable and patient connected to nasal cannula oxygen Cardiovascular status: blood pressure returned to baseline and stable Postop Assessment: no apparent nausea or vomiting Anesthetic complications: no  No notable events documented.  Last Vitals:  Vitals:   06/11/23 1200 06/11/23 1215  BP: (!) 166/85 (!) 167/90  Pulse: (!) 57 66  Resp: 10 18  Temp:  36.4 C  SpO2: 97% 99%    Last Pain:  Vitals:   06/11/23 1215  TempSrc:   PainSc: 0-No pain                 Vester Titsworth L Avangelina Flight

## 2023-06-11 NOTE — Anesthesia Preprocedure Evaluation (Addendum)
 Anesthesia Evaluation  Patient identified by MRN, date of birth, ID band Patient awake    Reviewed: Allergy & Precautions, NPO status , Patient's Chart, lab work & pertinent test results  Airway Mallampati: I  TM Distance: >3 FB Neck ROM: Full    Dental  (+) Dental Advisory Given, Missing,    Pulmonary neg pulmonary ROS   Pulmonary exam normal breath sounds clear to auscultation       Cardiovascular hypertension, Normal cardiovascular exam Rhythm:Regular Rate:Normal     Neuro/Psych  Headaches  negative psych ROS   GI/Hepatic negative GI ROS, Neg liver ROS,,,  Endo/Other  negative endocrine ROS    Renal/GU negative Renal ROS  negative genitourinary   Musculoskeletal negative musculoskeletal ROS (+)    Abdominal   Peds  Hematology negative hematology ROS (+)   Anesthesia Other Findings   Reproductive/Obstetrics                             Anesthesia Physical Anesthesia Plan  ASA: 2  Anesthesia Plan: General   Post-op Pain Management: Tylenol PO (pre-op)*   Induction: Intravenous  PONV Risk Score and Plan: 3 and Ondansetron, Dexamethasone and Midazolam  Airway Management Planned: LMA  Additional Equipment:   Intra-op Plan:   Post-operative Plan: Extubation in OR  Informed Consent: I have reviewed the patients History and Physical, chart, labs and discussed the procedure including the risks, benefits and alternatives for the proposed anesthesia with the patient or authorized representative who has indicated his/her understanding and acceptance.     Dental advisory given  Plan Discussed with: CRNA  Anesthesia Plan Comments:        Anesthesia Quick Evaluation

## 2023-06-11 NOTE — Discharge Instructions (Addendum)
 Post-surgical Instructions, Outpatient Surgery  You may expect to feel dizzy, weak, and drowsy for as long as 24 hours after receiving the medicine that made you sleep (anesthetic). For the first 24 hours after your surgery:   Do not drive a car, ride a bicycle, participate in physical activities, or take public transportation until you are done taking narcotic pain medicines or as directed by Dr. Briscoe Deutscher.  Do not drink alcohol or take tranquilizers.  Do not take medicine that has not been prescribed by your physicians.  Do not sign important papers or make important decisions while on narcotic pain medicines.  Have a responsible person with you.   PAIN MANAGEMENT Ibuprofen 800mg .  (This is the same as 4-200mg  over the counter tablets of Motrin or ibuprofen.)  Take this every 6 hours or as needed for cramping.   Acetaminophen 1000mg  (This is the same as 2-500mg  over the counter extra strength tylenol). Take this every 6 hours for the first 3 days or as needed afterwards for pain  DO'S AND DON'T'S Do not take a tub bath for 2 weeks.  You may shower on the first day after your surgery Do move around as you feel able.  Stairs are fine.  You may begin to exercise again as you feel able.  Do not lift any weights for two weeks. Do not put anything in the vagina for two weeks--no tampons, intercourse, or douching.    REGULAR MEDIATIONS/VITAMINS: You may restart all of your regular medications as prescribed. You may restart all of your vitamins as you normally take them.    PLEASE CALL OR SEEK MEDICAL CARE IF: You have persistent nausea and vomiting.  You have trouble eating or drinking.  You have an oral temperature above 100.5.  You have constipation that is not helped by adjusting diet or increasing fluid intake. Pain medicines are a common cause of constipation.  You have heavy vaginal bleeding You have redness or drainage from your incision(s) or there is increasing pain or tenderness near  or in the surgical site.   D & C Home care Instructions:   Personal hygiene:  Used sanitary napkins for vaginal drainage not tampons. Shower or tub bathe the day after your procedure. No douching until bleeding stops. Always wipe from front to back after  Elimination.  Activity: Do not drive or operate any equipment today. The effects of the anesthesia are still present and drowsiness may result. Rest today, not necessarily flat bed rest, just take it easy. You may resume your normal activity in one to 2 days.  Sexual activity: No intercourse for one week or as indicated by your physician  Diet: Eat a light diet as desired this evening. You may resume a regular diet tomorrow.  Return to work: One to 2 days.  General Expectations of your surgery: Vaginal bleeding should be no heavier than a normal period. Spotting may continue up to 10 days. Mild cramps may continue for a couple of days. You may have a regular period in 2-6 weeks.  Unexpected observations call your doctor if these occur: persistent or heavy bleeding. Severe abdominal cramping or pain. Elevation of temperature greater than 100F.  Post Anesthesia Home Care Instructions  Activity: Get plenty of rest for the remainder of the day. A responsible individual must stay with you for 24 hours following the procedure.  For the next 24 hours, DO NOT: -Drive a car -Advertising copywriter -Drink alcoholic beverages -Take any medication unless instructed by your  physician -Make any legal decisions or sign important papers.  Meals: Start with liquid foods such as gelatin or soup. Progress to regular foods as tolerated. Avoid greasy, spicy, heavy foods. If nausea and/or vomiting occur, drink only clear liquids until the nausea and/or vomiting subsides. Call your physician if vomiting continues.  Special Instructions/Symptoms: Your throat may feel dry or sore from the anesthesia or the breathing tube placed in your throat during surgery.  If this causes discomfort, gargle with warm salt water. The discomfort should disappear within 24 hours.  No acetaminophen/Tylenol until after 2:30 pm today if needed. No ibuprofen, Advil, Aleve, Motrin, ketorolac, meloxicam, naproxen, or other NSAIDS until after 5:30 pm today if needed.

## 2023-06-11 NOTE — Op Note (Signed)
 Elaine [name] PROCEDURE DATE: 06/11/2023  PREOPERATIVE DIAGNOSIS: postmenopausal bleeding  POSTOPERATIVE DIAGNOSIS: postmenopausal bleeding PROCEDURE:    operative hysteroscopy, dilation and curettage SURGEON: Lorriane Shire, MD ASSISTANT:  none  INDICATIONS: 58 y.o. [id number] with PMB.  Risks of surgery were discussed with the patient including but not limited to: bleeding which may require transfusion; infection which may require antibiotics; injury to surrounding organs; need for additional procedures including laparotomy;  and other postoperative/anesthesia complications. Written informed consent was obtained.    FINDINGS:  Normal external genitalia, small mobile uterus, normal appearing cervix Hysteroscopically: atrophic endocervical canal, scarred endometrial cavity, tubal ostia not visualized  ANESTHESIA: General, paracervical block INTRAVENOUS FLUIDS:  ml of LR ESTIMATED BLOOD LOSS:  25 ml SPECIMENS: endometrial curettings COMPLICATIONS:  None immediate.   FLUID DEFICIT: of normal saline  PROCEDURE: The patient was taken to the operating room and placed under general anesthesia. SCDs were in place.  Time out was performed. Patient was placed in dorsolithotomy in [place] stirrups. She was prepped and draped in the usual sterile fashion. A Red Rubber catheter was used to drain her bladder. A speculum was placeed in the vagina. The cervix was visualized anteriorly and grasped with a single-tooth tenaculum. Paracervical block was performed with 0.5% bupivicaine with 20 cc injected.  The dilator was introduced, initially not able to pass through but eventually there was no longer resistance met. The hysteroscope was inserted and the endometrial cavity and inspected. There appeared to be entrance into the scarred endometrial cavity, but not certain. The bladder was back filled and intraoperative ultrasound was used to confirm endometrial placement of the dilator and then then the  hysteroscope. Color flow was placed on the uterus and no feeder vessel was appreciated and no polyp visualized with scope. The hysteroscope was removed. Sharp curettage was performed in all 4 quadrants. All instruments were removed from the vagina. All instrument, needle and lap counts were correct x2. The patient was awakened and is recovering in stable condition.  Lorriane Shire, MD Minimally Invasive Gynecologic Surgery  Obstetrics and Gynecology, [facility] for [facility], [facility] 06/11/2023

## 2023-06-11 NOTE — Transfer of Care (Signed)
 Immediate Anesthesia Transfer of Care Note  Patient: [facility]  Procedure(s) Performed: DILATATION AND CURETTAGE /HYSTEROSCOPY (Bilateral: Uterus) ULTRASOUND GUIDANCE (Abdomen)  Patient Location: PACU  Anesthesia Type:General  Level of Consciousness: awake, alert , and oriented  Airway & Oxygen Therapy: Patient Spontanous Breathing  Post-op Assessment: Report given to RN, Post -op Vital signs reviewed and stable, and Patient moving all extremities X 4  Post vital signs: Reviewed and stable  Last Vitals:  Vitals Value Taken Time  BP 167/80 06/11/23 1146  Temp 36.5 C 06/11/23 1146  Pulse 62 06/11/23 1149  Resp 10 06/11/23 1149  SpO2 97 % 06/11/23 1149  Vitals shown include unfiled device data.  Last Pain:  Vitals:   06/11/23 1146  TempSrc:   PainSc: 0-No pain         Complications: No notable events documented.

## 2023-06-11 NOTE — H&P (Signed)
 OB/GYN Pre-Op History and Physical  Elaine [name] is a 58 y.o. [id number] presenting for operative hysteroscopy for postmenopausal spotting. Last noted spotting about 4 months ago.       Past Medical History:  Diagnosis Date   Anemia    Endometriosis    Frequent headaches    no migraine symptoms   Heart murmur 2006   echo in epic 03-23-2004   ef 55-65%,  mild MR and mild TR,  no stenosis   Hypertension    not on medication    Multiple thyroid nodules 12/2021   ultrasound in epic 12-16-2021;   one left side nodule meet bx critiria done 01-19-2022,  benign follicular   PMB (postmenopausal bleeding)    Uterine leiomyoma     Past Surgical History:  Procedure Laterality Date   ABLATION ON ENDOMETRIOSIS     BREAST DUCTAL SYSTEM EXCISION Left 06/10/2001   @[facility]  by dr Jamey Ripa   BREAST MASS EXCISION Left 1997   ductal hyperplasia w/ intraductal papilloma   BREAST MASS EXCISION Left 01/09/2014   @[facility]  by dr Shela Commons. Katrinka Blazing;    intraductal papilloma   HYSTEROSCOPY W/ ENDOMETRIAL ABLATION  10/27/2005   @[facility]  by dr Clearance Coots;   w/ bipolar   UMBILICAL HERNIA REPAIR      OB History  Gravida Para Term Preterm AB Living  3    1 2   SAB IAB Ectopic Multiple Live Births  1        # Outcome Date GA Lbr Len/2nd Weight Sex Type Anes PTL Lv  3 Gravida           2 Gravida           1 SAB             Social History   Socioeconomic History   Marital status: Legally Separated    Spouse name: Not on file   Number of children: Not on file   Years of education: Not on file   Highest education level: Not on file  Occupational History   Not on file  Tobacco Use   Smoking status: Never   Smokeless tobacco: Never  Substance and Sexual Activity   Alcohol use: No   Drug use: No   Sexual activity: Not Currently    Partners: Male    Birth control/protection: Surgical    Comment: BTL  Other Topics Concern   Not on file  Social History Narrative   Not on file   Social Drivers of Health    Financial Resource Strain: Not on file  Food Insecurity: Not on file  Transportation Needs: Not on file  Physical Activity: Not on file  Stress: Not on file  Social Connections: Unknown (07/19/2021)   Received from [facility], [facility]   Social Network    Social Network: Not on file    Family History  Problem Relation Age of Onset   Hypertension Mother    Diabetes Mother    Heart disease Mother    Cancer Father    Cancer Sister    Pancreatic cancer Sister    Hypertension Sister    Hypertension Sister    Hypertension Sister     Medications Prior to Admission  Medication Sig Dispense Refill Last Dose/Taking   cyanocobalamin 1000 MCG tablet Take 1,000 mcg by mouth daily.      Multiple Vitamin (MULTIVITAMIN PO) Take by mouth.      Vitamin D, Ergocalciferol, (DRISDOL) 1.25 MG (50000 UNIT) CAPS capsule Take 1 capsule (  50,000 Units total) by mouth every 7 (seven) days. 12 capsule 1     No Known Allergies  Review of Systems: Negative except for what is mentioned in HPI.     Physical Exam: BP (!) 162/84   Pulse 67   Temp 98.5 F (36.9 C) (Oral)   Resp 17   Ht 5\' 9"  (1.753 m)   Wt 70.3 kg   SpO2 98%   BMI 22.89 kg/m  CONSTITUTIONAL: Well-developed, well-nourished and in no acute distress.  HENT:  Normocephalic, atraumatic, External right and left ear normal. Oropharynx is clear and moist EYES: Conjunctivae and EOM are normal. Pupils are equal, round, and reactive to light. No scleral icterus.  NECK: Normal range of motion, supple, no masses SKIN: Skin is warm and dry. No rash noted. Not diaphoretic. No erythema. No pallor. NEUROLGIC: Alert and oriented to person, place, and time. Normal reflexes, muscle tone coordination. No cranial nerve deficit noted. PSYCHIATRIC: Normal mood and affect. Normal behavior. Normal judgment and thought content. RESPIRATORY: Normal effort PELVIC: Deferred   Pertinent Labs/Studies:   IMPRESSION: 1. Thickened postmenopausal  endometrium, poorly visualized due to transabdominal imaging only. Patient declined transvaginal ultrasound examination. Gynecological consultation and hysteroscopy are recommended for further evaluation.     Assessment and Plan :Elaine [name] is a 58 y.o. [id number] here for operative hysteroscopy.   Patient desires surgical management with postmenopausal spotting.  The risks of surgery were discussed in detail with the patient including but not limited to: bleeding which may require transfusion or reoperation; infection which may require prolonged hospitalization or re-hospitalization and antibiotic therapy; injury to bowel, bladder, ureters and major vessels or other surrounding organs which may lead to other procedures; formation of adhesions; need for additional procedures including laparotomy or subsequent procedures secondary to intraoperative injury or abnormal pathology; thromboembolic phenomenon; incisional problems and other postoperative or anesthesia complications.  Patient was told that the likelihood that her condition and symptoms will be treated effectively with this surgical management was high; the postoperative expectations were also discussed in detail. The patient also understands the alternative treatment options which were discussed in full. All questions were answered.      Lorriane Shire, M.D. Minimally Invasive Gynecologic Surgery and Pelvic Pain Specialist Attending Obstetrician & Gynecologist, [facility] [employer], [facility]

## 2023-06-12 ENCOUNTER — Encounter: Payer: Self-pay | Admitting: Obstetrics and Gynecology

## 2023-06-12 ENCOUNTER — Encounter (HOSPITAL_COMMUNITY): Payer: Self-pay | Admitting: Obstetrics and Gynecology

## 2023-06-12 ENCOUNTER — Telehealth: Payer: Self-pay | Admitting: Obstetrics and Gynecology

## 2023-06-12 LAB — SURGICAL PATHOLOGY

## 2023-06-12 NOTE — Telephone Encounter (Signed)
 Called, no answer, LVM.

## 2023-11-08 ENCOUNTER — Ambulatory Visit: Admitting: Podiatry

## 2024-01-24 ENCOUNTER — Emergency Department (HOSPITAL_BASED_OUTPATIENT_CLINIC_OR_DEPARTMENT_OTHER)
Admission: EM | Admit: 2024-01-24 | Discharge: 2024-01-24 | Disposition: A | Discharge status: Home / Self Care | Attending: Emergency Medicine | Admitting: Emergency Medicine

## 2024-01-24 ENCOUNTER — Emergency Department (HOSPITAL_BASED_OUTPATIENT_CLINIC_OR_DEPARTMENT_OTHER): Admitting: Radiology

## 2024-01-24 ENCOUNTER — Other Ambulatory Visit: Payer: Self-pay

## 2024-01-24 ENCOUNTER — Encounter (HOSPITAL_BASED_OUTPATIENT_CLINIC_OR_DEPARTMENT_OTHER): Payer: Self-pay | Admitting: Emergency Medicine

## 2024-01-24 DIAGNOSIS — I10 Essential (primary) hypertension: Secondary | ICD-10-CM | POA: Insufficient documentation

## 2024-01-24 DIAGNOSIS — I159 Secondary hypertension, unspecified: Secondary | ICD-10-CM | POA: Diagnosis not present

## 2024-01-24 DIAGNOSIS — R079 Chest pain, unspecified: Secondary | ICD-10-CM | POA: Diagnosis present

## 2024-01-24 LAB — CBC
HCT: 34.4 % — ABNORMAL LOW (ref 36.0–46.0)
Hemoglobin: 10.9 g/dL — ABNORMAL LOW (ref 12.0–15.0)
MCH: 26.1 pg (ref 26.0–34.0)
MCHC: 31.7 g/dL (ref 30.0–36.0)
MCV: 82.5 fL (ref 80.0–100.0)
Platelets: 232 K/uL (ref 150–400)
RBC: 4.17 MIL/uL (ref 3.87–5.11)
RDW: 13.3 % (ref 11.5–15.5)
WBC: 7.9 K/uL (ref 4.0–10.5)
nRBC: 0 % (ref 0.0–0.2)

## 2024-01-24 LAB — TROPONIN T, HIGH SENSITIVITY: Troponin T High Sensitivity: <15 ng/L (ref 0–19)

## 2024-01-24 LAB — BASIC METABOLIC PANEL WITH GFR
Anion gap: 10 (ref 5–15)
BUN: 12 mg/dL (ref 6–20)
CO2: 28 mmol/L (ref 22–32)
Calcium: 9.3 mg/dL (ref 8.9–10.3)
Chloride: 102 mmol/L (ref 98–111)
Creatinine, Ser: 0.92 mg/dL (ref 0.44–1.00)
GFR, Estimated: >60 mL/min (ref >60)
Glucose, Bld: 107 mg/dL — ABNORMAL HIGH (ref 70–99)
Potassium: 3.7 mmol/L (ref 3.5–5.1)
Sodium: 140 mmol/L (ref 135–145)

## 2024-01-24 NOTE — ED Provider Notes (Signed)
 [facility] EMERGENCY DEPARTMENT AT [facility] Provider Note   CSN: [id number] Arrival date & time: 01/24/24  8167     Patient presents with: Chest Pain   Elaine [name] is a 58 y.o. female.  With a history of hypertension hyperlipidemia who presents to ED for chest pain.  Left-sided chest pain for the last 2 days she first experienced at work.  Works as a financial risk analyst.  No associated shortness of breath nausea vomiting fevers chills    Chest Pain      Prior to Admission medications   Medication Sig Start Date End Date Taking? Authorizing Provider  cyanocobalamin  1000 MCG tablet Take 1,000 mcg by mouth daily.    [provider]  Multiple Vitamin (MULTIVITAMIN PO) Take by mouth.    [provider]  Vitamin D , Ergocalciferol , (DRISDOL ) 1.25 MG (50000 UNIT) CAPS capsule Take 1 capsule (50,000 Units total) by mouth every 7 (seven) days. 02/23/23   Billy Knee, FNP    Allergies: Patient has no known allergies.    Review of Systems  Cardiovascular:  Positive for chest pain.    Updated Vital Signs BP (!) 178/94   Pulse 71   Temp 98 F (36.7 C)   Resp 18   SpO2 100%   Physical Exam Vitals and nursing note reviewed.  HENT:     Head: Normocephalic and atraumatic.  Eyes:     Pupils: Pupils are equal, round, and reactive to light.  Cardiovascular:     Rate and Rhythm: Normal rate and regular rhythm.  Pulmonary:     Effort: Pulmonary effort is normal.     Breath sounds: Normal breath sounds.  Chest:     Chest wall: No tenderness.  Abdominal:     Palpations: Abdomen is soft.     Tenderness: There is no abdominal tenderness.  Skin:    General: Skin is warm and dry.  Neurological:     Mental Status: She is alert.  Psychiatric:        Mood and Affect: Mood normal.     (all labs ordered are listed, but only abnormal results are displayed) Labs Reviewed  BASIC METABOLIC PANEL WITH GFR - Abnormal; Notable for the following components:      Result  Value   Glucose, Bld 107 (*)    All other components within normal limits  CBC - Abnormal; Notable for the following components:   Hemoglobin 10.9 (*)    HCT 34.4 (*)    All other components within normal limits  TROPONIN T, HIGH SENSITIVITY    EKG: EKG Interpretation Date/Time:  Thursday January 24 2024 18:42:24 EST Ventricular Rate:  70 PR Interval:  240 QRS Duration:  92 QT Interval:  409 QTC Calculation: 442 R Axis:   83  Text Interpretation: Sinus rhythm Prolonged PR interval Confirmed by Pamella Sharper [phone]) on 01/24/2024 8:49:15 PM  Radiology: [facility] Chest 2 View Result Date: 01/24/2024 CLINICAL DATA:  Chest pain. EXAM: CHEST - 2 VIEW COMPARISON:  None Available. FINDINGS: The heart size and mediastinal contours are within normal limits. Both lungs are clear. The visualized skeletal structures are unremarkable. IMPRESSION: No active cardiopulmonary disease. Electronically Signed   By: Vanetta Chou M.D.   On: 01/24/2024 19:11     Procedures   Medications Ordered in the ED - No data to display  Medical Decision Making 58 year old female with history as above presenting for 2 days of chest pain.  Benign physical exam.  Workup negative for ACS.  EKG without evidence of ischemia or dysrhythmia.  Laboratory workup unremarkable overall.  Chest x-ray clear.  Stable for discharge.  Counseled patient on need for PCP follow-up to discuss repeat blood pressure measurements and potential need to start antihypertensive  Amount and/or Complexity of Data Reviewed Labs: ordered. Radiology: ordered.        Final diagnoses:  Secondary hypertension  Chest pain, unspecified type    ED Discharge Orders     None          Pamella Ozell LABOR, DO 01/24/24 2122

## 2024-01-24 NOTE — ED Triage Notes (Signed)
 Left side chest pain x 2 days  Comes and goes Denies sob, no n/v

## 2024-01-24 NOTE — Discharge Instructions (Signed)
 You were seen in the emerged from for chest pain Your blood work EKG and chest x-ray looked okay Your blood pressure was high here You will need to follow-up with your primary doctor to have your blood pressure rechecked and she may want to start you on a blood pressure medicine Return to the emergency room for chest pain trouble breathing or any other concerns

## 2024-01-29 ENCOUNTER — Inpatient Hospital Stay: Admitting: Nurse Practitioner
